# Patient Record
Sex: Female | Born: 1935 | Race: White | Hispanic: No | Smoking: Former smoker
Health system: Southern US, Community
[De-identification: ages and names within clinical notes are randomized; demographics above are authoritative.]

## PROBLEM LIST (undated history)

## (undated) DIAGNOSIS — I4891 Unspecified atrial fibrillation: Secondary | ICD-10-CM

## (undated) DIAGNOSIS — F0390 Unspecified dementia without behavioral disturbance: Secondary | ICD-10-CM

## (undated) DIAGNOSIS — F419 Anxiety disorder, unspecified: Secondary | ICD-10-CM

## (undated) DIAGNOSIS — M810 Age-related osteoporosis without current pathological fracture: Secondary | ICD-10-CM

## (undated) DIAGNOSIS — I219 Acute myocardial infarction, unspecified: Secondary | ICD-10-CM

## (undated) DIAGNOSIS — D649 Anemia, unspecified: Secondary | ICD-10-CM

## (undated) DIAGNOSIS — F32A Depression, unspecified: Secondary | ICD-10-CM

## (undated) DIAGNOSIS — I495 Sick sinus syndrome: Secondary | ICD-10-CM

## (undated) DIAGNOSIS — F329 Major depressive disorder, single episode, unspecified: Secondary | ICD-10-CM

## (undated) DIAGNOSIS — I10 Essential (primary) hypertension: Secondary | ICD-10-CM

## (undated) DIAGNOSIS — K219 Gastro-esophageal reflux disease without esophagitis: Secondary | ICD-10-CM

## (undated) HISTORY — PX: TONSILLECTOMY: SUR1361

## (undated) HISTORY — PX: PACEMAKER INSERTION: SHX728

## (undated) HISTORY — PX: BREAST CYST EXCISION: SHX579

## (undated) HISTORY — PX: RECTOCELE REPAIR: SHX761

## (undated) HISTORY — PX: APPENDECTOMY: SHX54

## (undated) HISTORY — PX: ABDOMINAL HYSTERECTOMY: SHX81

## (undated) HISTORY — PX: HERNIA REPAIR: SHX51

---

## 2013-06-25 ENCOUNTER — Encounter (HOSPITAL_COMMUNITY): Payer: Self-pay | Admitting: *Deleted

## 2013-06-25 ENCOUNTER — Inpatient Hospital Stay (HOSPITAL_COMMUNITY)
Admission: EM | Admit: 2013-06-25 | Discharge: 2013-06-27 | DRG: 281 | Disposition: A | Discharge status: Home Health | Payer: Medicare Other | Attending: Cardiovascular Disease | Admitting: Cardiovascular Disease

## 2013-06-25 ENCOUNTER — Emergency Department (HOSPITAL_COMMUNITY): Payer: Medicare Other

## 2013-06-25 DIAGNOSIS — I369 Nonrheumatic tricuspid valve disorder, unspecified: Secondary | ICD-10-CM

## 2013-06-25 DIAGNOSIS — I10 Essential (primary) hypertension: Secondary | ICD-10-CM | POA: Diagnosis present

## 2013-06-25 DIAGNOSIS — I495 Sick sinus syndrome: Secondary | ICD-10-CM | POA: Diagnosis present

## 2013-06-25 DIAGNOSIS — I959 Hypotension, unspecified: Secondary | ICD-10-CM | POA: Diagnosis present

## 2013-06-25 DIAGNOSIS — Z79899 Other long term (current) drug therapy: Secondary | ICD-10-CM

## 2013-06-25 DIAGNOSIS — Z8249 Family history of ischemic heart disease and other diseases of the circulatory system: Secondary | ICD-10-CM

## 2013-06-25 DIAGNOSIS — K219 Gastro-esophageal reflux disease without esophagitis: Secondary | ICD-10-CM | POA: Diagnosis present

## 2013-06-25 DIAGNOSIS — Z950 Presence of cardiac pacemaker: Secondary | ICD-10-CM

## 2013-06-25 DIAGNOSIS — I5181 Takotsubo syndrome: Secondary | ICD-10-CM | POA: Diagnosis present

## 2013-06-25 DIAGNOSIS — Z9089 Acquired absence of other organs: Secondary | ICD-10-CM

## 2013-06-25 DIAGNOSIS — D649 Anemia, unspecified: Secondary | ICD-10-CM

## 2013-06-25 DIAGNOSIS — Z7982 Long term (current) use of aspirin: Secondary | ICD-10-CM

## 2013-06-25 DIAGNOSIS — Z882 Allergy status to sulfonamides status: Secondary | ICD-10-CM

## 2013-06-25 DIAGNOSIS — E7800 Pure hypercholesterolemia, unspecified: Secondary | ICD-10-CM | POA: Diagnosis present

## 2013-06-25 DIAGNOSIS — R079 Chest pain, unspecified: Secondary | ICD-10-CM

## 2013-06-25 DIAGNOSIS — Z87891 Personal history of nicotine dependence: Secondary | ICD-10-CM

## 2013-06-25 DIAGNOSIS — I059 Rheumatic mitral valve disease, unspecified: Secondary | ICD-10-CM | POA: Diagnosis present

## 2013-06-25 DIAGNOSIS — I214 Non-ST elevation (NSTEMI) myocardial infarction: Principal | ICD-10-CM | POA: Diagnosis present

## 2013-06-25 DIAGNOSIS — I2510 Atherosclerotic heart disease of native coronary artery without angina pectoris: Secondary | ICD-10-CM | POA: Diagnosis present

## 2013-06-25 DIAGNOSIS — I4891 Unspecified atrial fibrillation: Secondary | ICD-10-CM | POA: Diagnosis present

## 2013-06-25 DIAGNOSIS — I079 Rheumatic tricuspid valve disease, unspecified: Secondary | ICD-10-CM | POA: Diagnosis present

## 2013-06-25 DIAGNOSIS — I422 Other hypertrophic cardiomyopathy: Secondary | ICD-10-CM | POA: Diagnosis not present

## 2013-06-25 HISTORY — DX: Unspecified atrial fibrillation: I48.91

## 2013-06-25 HISTORY — DX: Sick sinus syndrome: I49.5

## 2013-06-25 HISTORY — DX: Anemia, unspecified: D64.9

## 2013-06-25 HISTORY — DX: Gastro-esophageal reflux disease without esophagitis: K21.9

## 2013-06-25 HISTORY — DX: Essential (primary) hypertension: I10

## 2013-06-25 LAB — CBC WITH DIFFERENTIAL/PLATELET
Basophils Absolute: 0 10*3/uL (ref 0.0–0.1)
Basophils Relative: 0 % (ref 0–1)
Eosinophils Absolute: 0 10*3/uL (ref 0.0–0.7)
Hemoglobin: 12.1 g/dL (ref 12.0–15.0)
MCH: 33.8 pg (ref 26.0–34.0)
MCHC: 34.1 g/dL (ref 30.0–36.0)
Neutro Abs: 4.6 10*3/uL (ref 1.7–7.7)
Neutrophils Relative %: 73 % (ref 43–77)
Platelets: 198 10*3/uL (ref 150–400)

## 2013-06-25 LAB — BASIC METABOLIC PANEL
BUN: 14 mg/dL (ref 6–23)
Calcium: 9.7 mg/dL (ref 8.4–10.5)
GFR calc Af Amer: 74 mL/min — ABNORMAL LOW (ref >90)
GFR calc non Af Amer: 63 mL/min — ABNORMAL LOW (ref >90)
Glucose, Bld: 108 mg/dL — ABNORMAL HIGH (ref 70–99)
Potassium: 3.9 mEq/L (ref 3.5–5.1)
Sodium: 138 mEq/L (ref 135–145)

## 2013-06-25 LAB — HEPATIC FUNCTION PANEL
AST: 35 U/L (ref 0–37)
Bilirubin, Direct: <0.1 mg/dL (ref 0.0–0.3)
Total Protein: 7.4 g/dL (ref 6.0–8.3)

## 2013-06-25 LAB — MRSA PCR SCREENING: MRSA by PCR: NEGATIVE

## 2013-06-25 LAB — PROTIME-INR: INR: 1.26 (ref 0.00–1.49)

## 2013-06-25 LAB — TROPONIN I
Troponin I: 1.08 ng/mL (ref <0.30)
Troponin I: 0.8 ng/mL (ref <0.30)

## 2013-06-25 LAB — HEMOGLOBIN A1C: Mean Plasma Glucose: 103 mg/dL (ref <117)

## 2013-06-25 MED ORDER — ONDANSETRON HCL 4 MG/2ML IJ SOLN: 4.0000 mg | Freq: Four times a day (QID) | INTRAMUSCULAR | Status: DC | PRN

## 2013-06-25 MED ORDER — ASPIRIN EC 81 MG PO TBEC
81.0000 mg | DELAYED_RELEASE_TABLET | Freq: Every day | ORAL | Status: DC
  Administered 2013-06-26 – 2013-06-27 (×2): 81 mg via ORAL
  Filled 2013-06-25 (×2): qty 1

## 2013-06-25 MED ORDER — ROPINIROLE HCL 1 MG PO TABS
1.0000 mg | ORAL_TABLET | Freq: Every day | ORAL | Status: DC
  Administered 2013-06-25 – 2013-06-26 (×2): 1 mg via ORAL
  Filled 2013-06-25 (×4): qty 1

## 2013-06-25 MED ORDER — SODIUM CHLORIDE 0.9 % IJ SOLN
3.0000 mL | Freq: Two times a day (BID) | INTRAMUSCULAR | Status: DC
  Administered 2013-06-26: 3 mL via INTRAVENOUS

## 2013-06-25 MED ORDER — ASPIRIN 81 MG PO CHEW
324.0000 mg | CHEWABLE_TABLET | ORAL | Status: AC
  Administered 2013-06-25: 324 mg via ORAL
  Filled 2013-06-25: qty 4

## 2013-06-25 MED ORDER — OMEGA-3 FATTY ACIDS 1000 MG PO CAPS: 1.0000 g | ORAL_CAPSULE | Freq: Every day | ORAL | Status: DC

## 2013-06-25 MED ORDER — ACETAMINOPHEN 325 MG PO TABS
650.0000 mg | ORAL_TABLET | ORAL | Status: DC | PRN
  Administered 2013-06-25: 650 mg via ORAL
  Filled 2013-06-25: qty 2

## 2013-06-25 MED ORDER — METOPROLOL SUCCINATE ER 25 MG PO TB24
25.0000 mg | ORAL_TABLET | Freq: Every day | ORAL | Status: DC
  Administered 2013-06-25: 25 mg via ORAL
  Filled 2013-06-25 (×3): qty 1

## 2013-06-25 MED ORDER — CALCIUM CARBONATE-VITAMIN D 500-200 MG-UNIT PO TABS
1.0000 | ORAL_TABLET | Freq: Two times a day (BID) | ORAL | Status: DC
  Administered 2013-06-25 – 2013-06-27 (×4): 1 via ORAL
  Filled 2013-06-25 (×6): qty 1

## 2013-06-25 MED ORDER — LISINOPRIL 5 MG PO TABS
5.0000 mg | ORAL_TABLET | Freq: Every day | ORAL | Status: DC
  Administered 2013-06-25 – 2013-06-27 (×3): 5 mg via ORAL
  Filled 2013-06-25 (×3): qty 1

## 2013-06-25 MED ORDER — CALCIUM CARBONATE-VITAMIN D 600-400 MG-UNIT PO TABS: 1.0000 | ORAL_TABLET | Freq: Two times a day (BID) | ORAL | Status: DC

## 2013-06-25 MED ORDER — HEPARIN BOLUS VIA INFUSION
3000.0000 [IU] | Freq: Once | INTRAVENOUS | Status: AC
  Administered 2013-06-25: 3000 [IU] via INTRAVENOUS
  Filled 2013-06-25: qty 3000

## 2013-06-25 MED ORDER — MORPHINE SULFATE 2 MG/ML IJ SOLN
2.0000 mg | Freq: Once | INTRAMUSCULAR | Status: AC
  Administered 2013-06-25: 2 mg via INTRAVENOUS
  Filled 2013-06-25: qty 1

## 2013-06-25 MED ORDER — NITROGLYCERIN 0.4 MG SL SUBL: 0.4000 mg | SUBLINGUAL_TABLET | SUBLINGUAL | Status: DC | PRN

## 2013-06-25 MED ORDER — ALPRAZOLAM 0.25 MG PO TABS
0.2500 mg | ORAL_TABLET | Freq: Two times a day (BID) | ORAL | Status: DC | PRN
  Administered 2013-06-25 – 2013-06-26 (×2): 0.25 mg via ORAL
  Filled 2013-06-25 (×2): qty 1

## 2013-06-25 MED ORDER — SODIUM CHLORIDE 0.9 % IJ SOLN: 3.0000 mL | INTRAMUSCULAR | Status: DC | PRN

## 2013-06-25 MED ORDER — HEPARIN (PORCINE) IN NACL 100-0.45 UNIT/ML-% IJ SOLN
750.0000 [IU]/h | INTRAMUSCULAR | Status: DC
  Administered 2013-06-25: 750 [IU]/h via INTRAVENOUS
  Filled 2013-06-25 (×2): qty 250

## 2013-06-25 MED ORDER — ADULT MULTIVITAMIN W/MINERALS CH
1.0000 | ORAL_TABLET | Freq: Every day | ORAL | Status: DC
  Administered 2013-06-25 – 2013-06-27 (×3): 1 via ORAL
  Filled 2013-06-25 (×3): qty 1

## 2013-06-25 MED ORDER — OMEGA-3-ACID ETHYL ESTERS 1 G PO CAPS
1.0000 g | ORAL_CAPSULE | Freq: Every day | ORAL | Status: DC
  Administered 2013-06-25 – 2013-06-27 (×3): 1 g via ORAL
  Filled 2013-06-25 (×3): qty 1

## 2013-06-25 MED ORDER — PANTOPRAZOLE SODIUM 40 MG PO TBEC
40.0000 mg | DELAYED_RELEASE_TABLET | Freq: Every day | ORAL | Status: DC
  Administered 2013-06-25 – 2013-06-27 (×3): 40 mg via ORAL
  Filled 2013-06-25 (×3): qty 1

## 2013-06-25 MED ORDER — GABAPENTIN 300 MG PO CAPS
300.0000 mg | ORAL_CAPSULE | Freq: Two times a day (BID) | ORAL | Status: DC
  Administered 2013-06-25 – 2013-06-27 (×4): 300 mg via ORAL
  Filled 2013-06-25 (×6): qty 1

## 2013-06-25 MED ORDER — ATORVASTATIN CALCIUM 40 MG PO TABS
40.0000 mg | ORAL_TABLET | Freq: Every day | ORAL | Status: DC
  Administered 2013-06-25: 40 mg via ORAL
  Filled 2013-06-25 (×3): qty 1

## 2013-06-25 MED ORDER — TRAMADOL HCL 50 MG PO TABS
50.0000 mg | ORAL_TABLET | Freq: Two times a day (BID) | ORAL | Status: DC | PRN
  Administered 2013-06-25: 50 mg via ORAL
  Filled 2013-06-25: qty 1

## 2013-06-25 MED ORDER — KETOROLAC TROMETHAMINE 30 MG/ML IJ SOLN: 30.0000 mg | Freq: Once | INTRAMUSCULAR | Status: DC

## 2013-06-25 MED ORDER — SODIUM CHLORIDE 0.9 % IV SOLN
250.0000 mL | INTRAVENOUS | Status: DC | PRN
  Administered 2013-06-25: 250 mL via INTRAVENOUS

## 2013-06-25 NOTE — ED Notes (Signed)
ZOX:[id number]<AV> Expected date:<BR> Expected time:<BR> Means of arrival:<BR> Comments:<BR> Pt in room

## 2013-06-25 NOTE — ED Notes (Signed)
Pt reports left sided chest pain that started last night, increased throughout the night. Pain 8/10. Pain increases upon inspiration. Hx of pacemaker, placed in 2012. Reports she wears oxygen at night. Reports increased SOB. Pt recently moved this week from [place] to [place]. Pt reports she has been under a lot of stress lately.

## 2013-06-25 NOTE — ED Provider Notes (Signed)
History    CSN: [id number] Arrival date & time 06/25/13  0901  First MD Initiated Contact with Patient 06/25/13 [phone]     Chief Complaint  Patient presents with  . Chest Pain   (Consider location/radiation/quality/duration/timing/severity/associated sxs/prior Treatment) HPI.... left-sided chest pain since last evening with no radiation, worse with deep breaths, described as a stabbing pain, with associated dyspnea.   No nausea or diaphoresis.  Status post pacemaker placement 2 years ago in [place]. Additional cardiac risk factors include hypertension and hypercholesterolemia. Son had a fatal MI at age 1 and mother at 73.   Past Medical History  Diagnosis Date  . Hypertension   . Heart defect     pt unsure why, but pacemaker place 2012   Past Surgical History  Procedure Laterality Date  . Pacemaker insertion      2012  . Appendectomy    . Hernia repair    . Abdominal hysterectomy      partial  . Tonsillectomy    . Breast cyst excision     History reviewed. No pertinent family history. History  Substance Use Topics  . Smoking status: Former Smoker -- 1.00 packs/day for 25 years  . Smokeless tobacco: Not on file  . Alcohol Use: No   OB History   Grav Para Term Preterm Abortions TAB SAB Ect Mult Living                 Review of Systems  All other systems reviewed and are negative.    Allergies  Sulfa antibiotics  Home Medications   Current Outpatient Rx  Name  Route  Sig  Dispense  Refill  . atorvastatin (LIPITOR) 10 MG tablet   Oral   Take 10 mg by mouth daily.         . Biotin 3 MG TABS   Oral   Take 6 mg by mouth daily.         . Calcium Carbonate-Vitamin D (CALCIUM 600 + D PO)   Oral   Take 600 mg by mouth 2 (two) times daily.         . dabigatran (PRADAXA) 150 MG CAPS   Oral   Take 150 mg by mouth every 12 (twelve) hours.         . fish oil-omega-3 fatty acids 1000 MG capsule   Oral   Take 1 g by mouth daily.         Marland Kitchen  lisinopril (PRINIVIL,ZESTRIL) 5 MG tablet   Oral   Take 5 mg by mouth daily.         . memantine (NAMENDA) 10 MG tablet   Oral   Take 10 mg by mouth 2 (two) times daily.         . metoprolol succinate (TOPROL-XL) 25 MG 24 hr tablet   Oral   Take 25 mg by mouth daily.         . Multiple Vitamins-Minerals (MULTIVITAMIN WITH MINERALS) tablet   Oral   Take 1 tablet by mouth daily.         Marland Kitchen omeprazole (PRILOSEC) 20 MG capsule   Oral   Take 20 mg by mouth daily.         . simvastatin (ZOCOR) 40 MG tablet   Oral   Take 40 mg by mouth every evening.         . vitamin E 1000 UNIT capsule   Oral   Take 1,000 Units by mouth 2 (two) times  daily.          BP 118/60  Pulse 63  Temp(Src) 98 F (36.7 C)  Resp 20  SpO2 98% Physical Exam  Nursing note and vitals reviewed. Constitutional: She is oriented to person, place, and time.  Frail but pleasant;  mental status normal  HENT:  Head: Normocephalic and atraumatic.  Eyes: Conjunctivae and EOM are normal. Pupils are equal, round, and reactive to light.  Neck: Normal range of motion. Neck supple.  Cardiovascular: Normal rate, regular rhythm and normal heart sounds.   Pulmonary/Chest: Effort normal and breath sounds normal.  Abdominal: Soft. Bowel sounds are normal.  Musculoskeletal: Normal range of motion.  Neurological: She is alert and oriented to person, place, and time.  Skin: Skin is warm and dry.  Psychiatric: She has a normal mood and affect.    ED Course  Procedures (including critical care time) Labs Reviewed  BASIC METABOLIC PANEL - Abnormal; Notable for the following:    Glucose, Bld 108 (*)    GFR calc non Af Amer 63 (*)    GFR calc Af Amer 74 (*)    All other components within normal limits  CBC WITH DIFFERENTIAL - Abnormal; Notable for the following:    RBC 3.58 (*)    HCT 35.5 (*)    All other components within normal limits  TROPONIN I - Abnormal; Notable for the following:    Troponin I  1.08 (*)    All other components within normal limits   Dg Chest Portable 1 View  06/25/2013   *RADIOLOGY REPORT*  Clinical Data: Chest pain  PORTABLE CHEST - 1 VIEW  Comparison: None  Findings: There is a left chest wall pacer device with lead in the right ventricle.  There is mild cardiac enlargement.  Pulmonary vascular congestion is noted bilaterally.  No airspace consolidation.  IMPRESSION:  1.  Mild cardiac enlargement and pulmonary vascular congestion.   Original Report Authenticated By: Signa Kell, M.D.     Date: 06/25/2013  Rate: 65  Rhythm: paced rhythm  QRS Axis: normal  Intervals: normal  ST/T Wave abnormalities: normal  Conduction Disutrbances:none  Narrative Interpretation:   Old EKG Reviewed: none available  No diagnosis found.  MDM  Chest pain with multiple risk factors. Initial troponin positive at 1.08  Consult cardiology.   Patient is on Pradaxa.  Hold aspirin    Donnetta Hutching, MD 06/25/13 1051

## 2013-06-25 NOTE — ED Notes (Signed)
MD at bedside. 

## 2013-06-25 NOTE — H&P (Signed)
History and Physical  Patient ID: Wave Calzada MRN: [id number], DOB: [birth year] Date of Encounter: 06/25/2013, 12:05 PM Primary Physician: No primary provider on file. Primary Cardiologist: Dr. Daryel November in [place], pt just moved to [facility]  Chief Complaint: chest pain Reason for Admission: NSTEMI, first troponin 1.08  HPI: Ms. Franta is an 79 y/o F with no history of CAD but a history of HTN, afib on Pradaxa with tachybrady syndrome s/p pacemaker, GERD, and significant family history of CAD who presented to [facility] today with chest pain and SOB. (The patient was unaware of why she had a pacemaker and was on Pradaxa, but I spoke with Dr. Rondel Baton nurse for confirmation of history.) She and her husband moved to [place] just a few days ago to live in [place] after they found it difficult to keep up their longtime home in [place]. She has found this process extremely stressful and has had trouble sleeping. Last night around 7pm while at rest, she developed significant SOB and substernal sharp, severe chest pain that was worse with breathing. She denies orthopnea, LEE, palpitations or syncope. She did not try anything for the discomfort and finally called a family member to take her to the ER this morning. The chest pain subsided before coming to the ER. She was given morphine on arrival which she was hoping would help her sleep but she hasn't felt much different. She still has mild SOB but it is much improved from last night. Initial troponin is 1.08, EKG v-paced, CXR with mild cardiac enlargement and pulm vascular congestion. She thinks she took her Pradaxa this morning but is not entirely sure. VSS.  Past Medical History  Diagnosis Date  . Hypertension   . Sick sinus syndrome     Pacemaker ([employer]tr[employer]) - VVIR placed 2012 in [place], [place] & was put on Pradaxa  . GERD (gastroesophageal reflux disease)   . A-fib     Confirmed with pt's cardiology office -  Holter with tachybrady -> went on to have pacemaker     Most Recent Cardiac Studies: None available. Dr. Rondel Baton office said she had a nuclear in 2010 that looked OK. No recent echo in their system.   Surgical History:  Past Surgical History  Procedure Laterality Date  . Pacemaker insertion      2012  . Appendectomy    . Hernia repair    . Abdominal hysterectomy      partial  . Tonsillectomy    . Breast cyst excision    . Rectocele repair      In 2000 - had complications from surgery - abdominal organs couldn't function, in critical condition & in hospital for 2 weeks     Home Meds: Prior to Admission medications   Medication Sig Start Date End Date Taking? Authorizing Provider  Biotin 3 MG TABS Take 6 mg by mouth daily.   Yes Historical Provider, MD  Calcium Carbonate-Vitamin D (CALCIUM 600 + D PO) Take 600 mg by mouth 2 (two) times daily.   Yes Historical Provider, MD  dabigatran (PRADAXA) 150 MG CAPS Take 150 mg by mouth every 12 (twelve) hours.   Yes Historical Provider, MD  fish oil-omega-3 fatty acids 1000 MG capsule Take 1 g by mouth daily.   Yes Historical Provider, MD  lisinopril (PRINIVIL,ZESTRIL) 5 MG tablet Take 5 mg by mouth daily.   Yes Historical Provider, MD  metoprolol succinate (TOPROL-XL) 25 MG 24 hr tablet Take 25 mg by mouth at  bedtime.    Yes Historical Provider, MD  Multiple Vitamins-Minerals (MULTIVITAMIN WITH MINERALS) tablet Take 1 tablet by mouth daily.   Yes Historical Provider, MD  omeprazole (PRILOSEC) 20 MG capsule Take 20 mg by mouth daily.   Yes Historical Provider, MD  vitamin E 1000 UNIT capsule Take 1,000 Units by mouth 2 (two) times daily.   Yes Historical Provider, MD    Allergies:  Allergies  Allergen Reactions  . Sulfa Antibiotics Rash    History   Social History  . Marital Status: Married    Spouse Name: N/A    Number of Children: N/A  . Years of Education: N/A   Occupational History  . Not on file.   Social History Main  Topics  . Smoking status: Former Smoker -- 1.00 packs/day for 25 years  . Smokeless tobacco: Not on file  . Alcohol Use: No  . Drug Use: No  . Sexually Active: Not on file   Other Topics Concern  . Not on file   Social History Narrative  . No narrative on file     Family History  Problem Relation Age of Onset  . Heart attack Son     Died at 65 of MI  . Heart attack Mother     Died at 54 of MI    Review of Systems: General: negative for chills, fever.  Cardiovascular: see above Dermatological: negative for rash Respiratory: negative for cough Urologic: negative for hematuria Abdominal: negative for nausea, vomiting, bleeding Neurologic: negative for visual changes, syncope, or dizziness All other systems reviewed and are otherwise negative except as noted above.  Labs:   Lab Results  Component Value Date   WBC 6.3 06/25/2013   HGB 12.1 06/25/2013   HCT 35.5* 06/25/2013   MCV 99.2 06/25/2013   PLT 198 06/25/2013     Recent Labs Lab 06/25/13 0912  NA 138  K 3.9  CL 100  CO2 27  BUN 14  CREATININE 0.82  CALCIUM 9.7  GLUCOSE 108*    Recent Labs  06/25/13 0912  TROPONINI 1.08*    Radiology/Studies:  Dg Chest Portable 1 View 06/25/2013   *RADIOLOGY REPORT*  Clinical Data: Chest pain  PORTABLE CHEST - 1 VIEW  Comparison: None  Findings: There is a left chest wall pacer device with lead in the right ventricle.  There is mild cardiac enlargement.  Pulmonary vascular congestion is noted bilaterally.  No airspace consolidation.  IMPRESSION:  1.  Mild cardiac enlargement and pulmonary vascular congestion.   Original Report Authenticated By: Signa Kell, M.D.     EKG: vpaced 65bpm ?underlying rhythm afib, deep TWI V4-V6, TWI to a lesser degree avL,no prior to compare to  Physical Exam: Blood pressure 114/54, pulse 60, temperature 98 F (36.7 C), resp. rate 23, SpO2 96.00%. General: Well developed thin elderly WF in no acute distress. Head: Normocephalic,  atraumatic, sclera non-icteric, no xanthomas, nares are without discharge.  Neck: Negative for carotid bruits. JVD not elevated. Lungs: Clear bilaterally to auscultation without wheezes, rales, or rhonchi. Breathing is unlabored. Heart: RRR with S1 S2. No murmurs, rubs, or gallops appreciated. Abdomen: Soft, non-tender, non-distended with normoactive bowel sounds. No hepatomegaly. No rebound/guarding. No obvious abdominal masses. Msk:  Strength and tone appear normal for age. Extremities: No clubbing or cyanosis. No edema.  Distal pedal pulses are 2+ and equal bilaterally. Neuro: Alert and oriented X 3. No focal deficit. No facial asymmetry. Moves all extremities spontaneously. Psych:  Responds to questions appropriately with  a normal affect.    ASSESSMENT AND PLAN:  1. NSTEMI - will admit, cycle cardiac enzymes, start heparin per pharmacy tonight (pt thinks last dose of Pradaxa was this morning). She is not convinced she wants to proceed with heart catheterization yet so will tentatively keep NPO after midnight and reassess in AM. Per discussion with Dr. Eden Emms, will keep at [facility] but will need transfer to [facility] if she elects cath. Check echocardiogram (called them to do today).  Add ASA for now. PE less likely given consistent anticoagulation with Pradaxa, but there is a mild pleuritic component. Check lipids. Add statin. 2. Atrial fibrillation with tachybrady syndrome s/p pacemaker 2012 - will ask [employer] to interrogate pacemaker. Hold Pradaxa as above. 3. HTN, controlled - continue home regimen. 4. Reported history of anemia - H/H ok. No reported bleeding. Continue PPI.  Signed, Ronie Spies PA-C 06/25/2013, 12:05 PM  Patient examined chart reviewed. SEMI in elderly female with no history of CAD.  ECG unreveiling due to pacing. Currently pain free. Will have to see how troponins trend and EF by echo but suspect cath in am will be best.  Pradaxa held so should be ok for PM case tomorrow.  If echo  normal and troponin goes no higher may consider myvue as patient would like to avoid cath if possible  Charlton Haws

## 2013-06-25 NOTE — Progress Notes (Signed)
  Echocardiogram 2D Echocardiogram has been performed.  Cathie Beams 06/25/2013, 2:33 PM

## 2013-06-25 NOTE — Progress Notes (Signed)
ANTICOAGULATION CONSULT NOTE - Initial Consult  Pharmacy Consult for Heparin  Indication: NSTEMI  Allergies  Allergen Reactions  . Sulfa Antibiotics Rash    Patient Measurements: Height: 5\' 1"  (154.9 cm) Weight: 145 lb (65.772 kg) IBW/kg (Calculated) : 47.8 Heparin Dosing Weight:  Vital Signs: Temp: 98.2 F (36.8 C) (07/17 1247) Temp src: Oral (07/17 1247) BP: 106/52 mmHg (07/17 1522) Pulse Rate: 72 (07/17 1522)  Labs:  Recent Labs  06/25/13 0912 06/25/13 1318  HGB 12.1  --   HCT 35.5*  --   PLT 198  --   CREATININE 0.82  --   TROPONINI 1.08* 0.80*    Estimated Creatinine Clearance: 43.6 ml/min (by C-G formula based on Cr of 0.82).   Medical History: Past Medical History  Diagnosis Date  . Hypertension   . Sick sinus syndrome     Pacemaker (Medtronic) - VVIR placed 2012 in [place], [place] & was put on Pradaxa  . GERD (gastroesophageal reflux disease)   . A-fib     Confirmed with pt's cardiology office - Holter with tachybrady -> went on to have pacemaker  . Anemia     Prior h/o such, per [facility] cardiologist's office    Medications:  Prescriptions prior to admission  Medication Sig Dispense Refill  . Biotin 3 MG TABS Take 6 mg by mouth daily.      . Calcium Carbonate-Vitamin D (CALCIUM 600 + D PO) Take 600 mg by mouth 2 (two) times daily.      . dabigatran (PRADAXA) 150 MG CAPS Take 150 mg by mouth every 12 (twelve) hours.      . fish oil-omega-3 fatty acids 1000 MG capsule Take 1 g by mouth daily.      Marland Kitchen gabapentin (NEURONTIN) 300 MG capsule Take 300 mg by mouth 2 (two) times daily.      Marland Kitchen lisinopril (PRINIVIL,ZESTRIL) 5 MG tablet Take 5 mg by mouth daily.      . metoprolol succinate (TOPROL-XL) 25 MG 24 hr tablet Take 25 mg by mouth at bedtime.       . Multiple Vitamins-Minerals (MULTIVITAMIN WITH MINERALS) tablet Take 1 tablet by mouth daily.      Marland Kitchen omeprazole (PRILOSEC) 20 MG capsule Take 20 mg by mouth daily.      Marland Kitchen rOPINIRole (REQUIP) 1 MG tablet  Take 1 mg by mouth at bedtime.      . vitamin E 1000 UNIT capsule Take 1,000 Units by mouth 2 (two) times daily.       Scheduled:  . [START ON 06/26/2013] aspirin EC  81 mg Oral Daily  . atorvastatin  40 mg Oral q1800  . calcium-vitamin D  1 tablet Oral BID  . gabapentin  300 mg Oral BID  . lisinopril  5 mg Oral Daily  . metoprolol succinate  25 mg Oral QHS  . multivitamin with minerals  1 tablet Oral Daily  . omega-3 acid ethyl esters  1 g Oral Daily  . pantoprazole  40 mg Oral Daily  . rOPINIRole  1 mg Oral QHS  . sodium chloride  3 mL Intravenous Q12H   Infusions:   PRN: sodium chloride, acetaminophen, nitroGLYCERIN, ondansetron (ZOFRAN) IV, sodium chloride Anti-infectives   None      Assessment:  79 yo F admitted with NSTEMI starting heparin per pharmacy.  Patient is on Pradaxa at home, Last dose was this morning at 7am.  Per protocol wait 12 hours after last Pradaxa dose before initating IV anticoagulation.  Patients Scr  is WNL (0.82) with estimated CrCl 43 ml/min.   Baseline CBC is WNL, will order baseline INR  Goal of Therapy:  Heparin level 0.3-0.7 units/ml Monitor platelets by anticoagulation protocol: Yes   Plan:  1.) Wait 12 hours after last pradaxa dose to start IV heparin, Will start at 7pm 2.) Given age, will give partial bolus of 3000 units x 1, then start 750 units/hr 3.) Daily heparin level and CBC, PT/INR now 4.) Heparin level 8 hours after gtt starts given age > 32 yo 5.) Hold Pradaxa  Latesa Fratto, Loma Messing PharmD Pager #: [phone] 4:10 PM 06/25/2013

## 2013-06-26 ENCOUNTER — Encounter (HOSPITAL_COMMUNITY)
Admission: EM | Disposition: A | Discharge status: Home Health | Payer: Self-pay | Source: Home / Self Care | Attending: Cardiovascular Disease

## 2013-06-26 DIAGNOSIS — I214 Non-ST elevation (NSTEMI) myocardial infarction: Secondary | ICD-10-CM

## 2013-06-26 HISTORY — PX: LEFT HEART CATHETERIZATION WITH CORONARY ANGIOGRAM: SHX5451

## 2013-06-26 LAB — LIPID PANEL
LDL Cholesterol: 67 mg/dL (ref 0–99)
VLDL: 16 mg/dL (ref 0–40)

## 2013-06-26 LAB — CBC
HCT: 32.3 % — ABNORMAL LOW (ref 36.0–46.0)
Hemoglobin: 10.8 g/dL — ABNORMAL LOW (ref 12.0–15.0)
MCH: 33.8 pg (ref 26.0–34.0)
MCV: 100.9 fL — ABNORMAL HIGH (ref 78.0–100.0)
MCV: 98.9 fL (ref 78.0–100.0)
Platelets: 162 10*3/uL (ref 150–400)
RBC: 3.2 MIL/uL — ABNORMAL LOW (ref 3.87–5.11)
RBC: 3.64 MIL/uL — ABNORMAL LOW (ref 3.87–5.11)
RDW: 12.7 % (ref 11.5–15.5)
WBC: 5.3 10*3/uL (ref 4.0–10.5)

## 2013-06-26 LAB — TROPONIN I: Troponin I: <0.3 ng/mL (ref <0.30)

## 2013-06-26 LAB — BASIC METABOLIC PANEL
CO2: 28 mEq/L (ref 19–32)
Calcium: 9.5 mg/dL (ref 8.4–10.5)
Creatinine, Ser: 0.85 mg/dL (ref 0.50–1.10)
Glucose, Bld: 107 mg/dL — ABNORMAL HIGH (ref 70–99)

## 2013-06-26 LAB — CREATININE, SERUM
Creatinine, Ser: 0.76 mg/dL (ref 0.50–1.10)
GFR calc Af Amer: 87 mL/min — ABNORMAL LOW (ref >90)
GFR calc non Af Amer: 75 mL/min — ABNORMAL LOW (ref >90)

## 2013-06-26 SURGERY — LEFT HEART CATHETERIZATION WITH CORONARY ANGIOGRAM
Anesthesia: LOCAL

## 2013-06-26 MED ORDER — FENTANYL CITRATE 0.05 MG/ML IJ SOLN
INTRAMUSCULAR | Status: AC
  Filled 2013-06-26: qty 2

## 2013-06-26 MED ORDER — MIDAZOLAM HCL 2 MG/2ML IJ SOLN
INTRAMUSCULAR | Status: AC
  Filled 2013-06-26: qty 2

## 2013-06-26 MED ORDER — SODIUM CHLORIDE 0.9 % IJ SOLN: 3.0000 mL | Freq: Two times a day (BID) | INTRAMUSCULAR | Status: DC

## 2013-06-26 MED ORDER — SODIUM CHLORIDE 0.9 % IJ SOLN: 3.0000 mL | INTRAMUSCULAR | Status: DC | PRN

## 2013-06-26 MED ORDER — ACETAMINOPHEN 325 MG PO TABS
650.0000 mg | ORAL_TABLET | ORAL | Status: DC | PRN
  Administered 2013-06-27: 650 mg via ORAL
  Filled 2013-06-26: qty 2

## 2013-06-26 MED ORDER — SODIUM CHLORIDE 0.9 % IV SOLN: 250.0000 mL | INTRAVENOUS | Status: DC | PRN

## 2013-06-26 MED ORDER — SODIUM CHLORIDE 0.9 % IV SOLN: INTRAVENOUS | Status: AC

## 2013-06-26 MED ORDER — NITROGLYCERIN 0.2 MG/ML ON CALL CATH LAB
INTRAVENOUS | Status: AC
  Filled 2013-06-26: qty 1

## 2013-06-26 MED ORDER — ONDANSETRON HCL 4 MG/2ML IJ SOLN: 4.0000 mg | Freq: Four times a day (QID) | INTRAMUSCULAR | Status: DC | PRN

## 2013-06-26 MED ORDER — HEPARIN (PORCINE) IN NACL 2-0.9 UNIT/ML-% IJ SOLN
INTRAMUSCULAR | Status: AC
  Filled 2013-06-26: qty 1000

## 2013-06-26 MED ORDER — HEPARIN SODIUM (PORCINE) 5000 UNIT/ML IJ SOLN
5000.0000 [IU] | Freq: Three times a day (TID) | INTRAMUSCULAR | Status: DC
  Administered 2013-06-26 – 2013-06-27 (×2): 5000 [IU] via SUBCUTANEOUS
  Filled 2013-06-26 (×5): qty 1

## 2013-06-26 MED ORDER — LIDOCAINE HCL (PF) 1 % IJ SOLN
INTRAMUSCULAR | Status: AC
  Filled 2013-06-26: qty 30

## 2013-06-26 NOTE — Progress Notes (Signed)
ANTICOAGULATION CONSULT NOTE - Follow Up Consult  Pharmacy Consult for Heparin Indication: NSTEMI  Allergies  Allergen Reactions  . Sulfa Antibiotics Rash    Patient Measurements: Height: 5\' 1"  (154.9 cm) Weight: 145 lb (65.772 kg) IBW/kg (Calculated) : 47.8 Heparin Dosing Weight:   Vital Signs: Temp: 97.8 F (36.6 C) (07/18 0400) Temp src: Axillary (07/18 0400) BP: 115/94 mmHg (07/18 0420) Pulse Rate: 59 (07/18 0400)  Labs:  Recent Labs  06/25/13 0912 06/25/13 1318 06/25/13 1516 06/25/13 1935 06/26/13 0055 06/26/13 0525  HGB 12.1  --   --   --   --  10.8*  HCT 35.5*  --   --   --   --  32.3*  PLT 198  --   --   --   --  169  LABPROT  --   --  15.5*  --   --   --   INR  --   --  1.26  --   --   --   HEPARINUNFRC  --   --   --   --   --  0.34  CREATININE 0.82  --   --   --   --  0.85  TROPONINI 1.08* 0.80*  --  0.42* <0.30  --     Estimated Creatinine Clearance: 42 ml/min (by C-G formula based on Cr of 0.85).   Medications:  Infusions:  . heparin 750 Units/hr (06/26/13 0600)    Assessment: Patient with heparin level at goal.  No issues per RN.  Goal of Therapy:  Heparin level 0.3-0.7 units/ml Monitor platelets by anticoagulation protocol: Yes   Plan:  Continue heparin at current rate. Recheck level at 1400.  Darlina Guys, Jacquenette Shone Crowford 06/26/2013,6:23 AM

## 2013-06-26 NOTE — Progress Notes (Signed)
 SUBJECTIVE:  No further chest pain.  Anxious   PHYSICAL EXAM Filed Vitals:   06/26/13 0000 06/26/13 0032 06/26/13 0400 06/26/13 0420  BP:  110/40  115/94  Pulse: 59  59   Temp: 97.2 F (36.2 C)  97.8 F (36.6 C)   TempSrc: Oral  Axillary   Resp: 17  18   Height:      Weight:      SpO2: 95%  98%    General:  No distress Lungs:  Few basilar crackles Heart:  RRR Abdomen:  Positive bowel sounds, no rebound no guarding Extremities:  No edema Neuro:  Nofocal   LABS: Lab Results  Component Value Date   TROPONINI <0.30 06/26/2013   Results for orders placed during the hospital encounter of 06/25/13 (from the past 24 hour(s))  BASIC METABOLIC PANEL     Status: Abnormal   Collection Time    06/25/13  9:12 AM      Result Value Range   Sodium 138  135 - 145 mEq/L   Potassium 3.9  3.5 - 5.1 mEq/L   Chloride 100  96 - 112 mEq/L   CO2 27  19 - 32 mEq/L   Glucose, Bld 108 (*) 70 - 99 mg/dL   BUN 14  6 - 23 mg/dL   Creatinine, Ser 0.82  0.50 - 1.10 mg/dL   Calcium 9.7  8.4 - 10.5 mg/dL   GFR calc non Af Amer 63 (*) >90 mL/min   GFR calc Af Amer 74 (*) >90 mL/min  CBC WITH DIFFERENTIAL     Status: Abnormal   Collection Time    06/25/13  9:12 AM      Result Value Range   WBC 6.3  4.0 - 10.5 K/uL   RBC 3.58 (*) 3.87 - 5.11 MIL/uL   Hemoglobin 12.1  12.0 - 15.0 g/dL   HCT 35.5 (*) 36.0 - 46.0 %   MCV 99.2  78.0 - 100.0 fL   MCH 33.8  26.0 - 34.0 pg   MCHC 34.1  30.0 - 36.0 g/dL   RDW 12.5  11.5 - 15.5 %   Platelets 198  150 - 400 K/uL   Neutrophils Relative % 73  43 - 77 %   Neutro Abs 4.6  1.7 - 7.7 K/uL   Lymphocytes Relative 20  12 - 46 %   Lymphs Abs 1.3  0.7 - 4.0 K/uL   Monocytes Relative 6  3 - 12 %   Monocytes Absolute 0.4  0.1 - 1.0 K/uL   Eosinophils Relative 0  0 - 5 %   Eosinophils Absolute 0.0  0.0 - 0.7 K/uL   Basophils Relative 0  0 - 1 %   Basophils Absolute 0.0  0.0 - 0.1 K/uL  TROPONIN I     Status: Abnormal   Collection Time    06/25/13  9:12 AM        Result Value Range   Troponin I 1.08 (*) <0.30 ng/mL  TROPONIN I     Status: Abnormal   Collection Time    06/25/13  1:18 PM      Result Value Range   Troponin I 0.80 (*) <0.30 ng/mL  TSH     Status: None   Collection Time    06/25/13  1:18 PM      Result Value Range   TSH 4.284  0.350 - 4.500 uIU/mL  HEMOGLOBIN A1C     Status: None   Collection Time      06/25/13  1:18 PM      Result Value Range   Hemoglobin A1C 5.2  <5.7 %   Mean Plasma Glucose 103  <117 mg/dL  HEPATIC FUNCTION PANEL     Status: None   Collection Time    06/25/13  3:16 PM      Result Value Range   Total Protein 7.4  6.0 - 8.3 g/dL   Albumin 3.7  3.5 - 5.2 g/dL   AST 35  0 - 37 U/L   ALT 16  0 - 35 U/L   Alkaline Phosphatase 54  39 - 117 U/L   Total Bilirubin 0.5  0.3 - 1.2 mg/dL   Bilirubin, Direct <0.1  0.0 - 0.3 mg/dL   Indirect Bilirubin NOT CALCULATED  0.3 - 0.9 mg/dL  PROTIME-INR     Status: Abnormal   Collection Time    06/25/13  3:16 PM      Result Value Range   Prothrombin Time 15.5 (*) 11.6 - 15.2 seconds   INR 1.26  0.00 - 1.49  MRSA PCR SCREENING     Status: None   Collection Time    06/25/13  3:46 PM      Result Value Range   MRSA by PCR NEGATIVE  NEGATIVE  TROPONIN I     Status: Abnormal   Collection Time    06/25/13  7:35 PM      Result Value Range   Troponin I 0.42 (*) <0.30 ng/mL  TROPONIN I     Status: None   Collection Time    06/26/13 12:55 AM      Result Value Range   Troponin I <0.30  <0.30 ng/mL  CBC     Status: Abnormal   Collection Time    06/26/13  5:25 AM      Result Value Range   WBC 5.9  4.0 - 10.5 K/uL   RBC 3.20 (*) 3.87 - 5.11 MIL/uL   Hemoglobin 10.8 (*) 12.0 - 15.0 g/dL   HCT 32.3 (*) 36.0 - 46.0 %   MCV 100.9 (*) 78.0 - 100.0 fL   MCH 33.8  26.0 - 34.0 pg   MCHC 33.4  30.0 - 36.0 g/dL   RDW 12.7  11.5 - 15.5 %   Platelets 169  150 - 400 K/uL  BASIC METABOLIC PANEL     Status: Abnormal   Collection Time    06/26/13  5:25 AM      Result Value  Range   Sodium 133 (*) 135 - 145 mEq/L   Potassium 4.1  3.5 - 5.1 mEq/L   Chloride 99  96 - 112 mEq/L   CO2 28  19 - 32 mEq/L   Glucose, Bld 107 (*) 70 - 99 mg/dL   BUN 18  6 - 23 mg/dL   Creatinine, Ser 0.85  0.50 - 1.10 mg/dL   Calcium 9.5  8.4 - 10.5 mg/dL   GFR calc non Af Amer 61 (*) >90 mL/min   GFR calc Af Amer 70 (*) >90 mL/min  LIPID PANEL     Status: None   Collection Time    06/26/13  5:25 AM      Result Value Range   Cholesterol 143  0 - 200 mg/dL   Triglycerides 78  <150 mg/dL   HDL 60  >39 mg/dL   Total CHOL/HDL Ratio 2.4     VLDL 16  0 - 40 mg/dL   LDL Cholesterol 67  0 - 99 mg/dL    HEPARIN LEVEL (UNFRACTIONATED)     Status: None   Collection Time    06/26/13  5:25 AM      Result Value Range   Heparin Unfractionated 0.34  0.30 - 0.70 IU/mL    Intake/Output Summary (Last 24 hours) at 06/26/13 0752 Last data filed at 06/26/13 0600  Gross per 24 hour  Intake    290 ml  Output    550 ml  Net   -260 ml    ASSESSMENT AND PLAN:  NQWMI:  Troponin has trended down.   EF 25% by echo.   New cardiomyopathy per her daughter who is a nurse.  Needs cardiac cath.  She can have it this PM.  She has been off of Pradaxa with the last dose Wed night.  The patient understands that risks included but are not limited to stroke (1 in 1000), death (1 in 1000), kidney failure [usually temporary] (1 in 500), bleeding (1 in 200), allergic reaction [possibly serious] (1 in 200).  The patient understands and agrees to proceed.   Discussed with the patient and her daughter.   Anemia:  No active signs of bleeding.  No recent history of this.  Check stool guaiac. She does have small hemorrhoid.   Marie Gonzalez 06/26/2013 7:52 AM   

## 2013-06-26 NOTE — H&P (View-Only) (Signed)
SUBJECTIVE:  No further chest pain.  Anxious   PHYSICAL EXAM Filed Vitals:   06/26/13 0000 06/26/13 0032 06/26/13 0400 06/26/13 0420  BP:  110/40  115/94  Pulse: 59  59   Temp: 97.2 F (36.2 C)  97.8 F (36.6 C)   TempSrc: Oral  Axillary   Resp: 17  18   Height:      Weight:      SpO2: 95%  98%    General:  No distress Lungs:  Few basilar crackles Heart:  RRR Abdomen:  Positive bowel sounds, no rebound no guarding Extremities:  No edema Neuro:  Nofocal   LABS: Lab Results  Component Value Date   TROPONINI <0.30 06/26/2013   Results for orders placed during the hospital encounter of 06/25/13 (from the past 24 hour(s))  BASIC METABOLIC PANEL     Status: Abnormal   Collection Time    06/25/13  9:12 AM      Result Value Range   Sodium 138  135 - 145 mEq/L   Potassium 3.9  3.5 - 5.1 mEq/L   Chloride 100  96 - 112 mEq/L   CO2 27  19 - 32 mEq/L   Glucose, Bld 108 (*) 70 - 99 mg/dL   BUN 14  6 - 23 mg/dL   Creatinine, Ser 4.40  0.50 - 1.10 mg/dL   Calcium 9.7  8.4 - 34.7 mg/dL   GFR calc non Af Amer 63 (*) >90 mL/min   GFR calc Af Amer 74 (*) >90 mL/min  CBC WITH DIFFERENTIAL     Status: Abnormal   Collection Time    06/25/13  9:12 AM      Result Value Range   WBC 6.3  4.0 - 10.5 K/uL   RBC 3.58 (*) 3.87 - 5.11 MIL/uL   Hemoglobin 12.1  12.0 - 15.0 g/dL   HCT 42.5 (*) 95.6 - 38.7 %   MCV 99.2  78.0 - 100.0 fL   MCH 33.8  26.0 - 34.0 pg   MCHC 34.1  30.0 - 36.0 g/dL   RDW 56.4  33.2 - 95.1 %   Platelets 198  150 - 400 K/uL   Neutrophils Relative % 73  43 - 77 %   Neutro Abs 4.6  1.7 - 7.7 K/uL   Lymphocytes Relative 20  12 - 46 %   Lymphs Abs 1.3  0.7 - 4.0 K/uL   Monocytes Relative 6  3 - 12 %   Monocytes Absolute 0.4  0.1 - 1.0 K/uL   Eosinophils Relative 0  0 - 5 %   Eosinophils Absolute 0.0  0.0 - 0.7 K/uL   Basophils Relative 0  0 - 1 %   Basophils Absolute 0.0  0.0 - 0.1 K/uL  TROPONIN I     Status: Abnormal   Collection Time    06/25/13  9:12 AM        Result Value Range   Troponin I 1.08 (*) <0.30 ng/mL  TROPONIN I     Status: Abnormal   Collection Time    06/25/13  1:18 PM      Result Value Range   Troponin I 0.80 (*) <0.30 ng/mL  TSH     Status: None   Collection Time    06/25/13  1:18 PM      Result Value Range   TSH 4.284  0.350 - 4.500 uIU/mL  HEMOGLOBIN A1C     Status: None   Collection Time  06/25/13  1:18 PM      Result Value Range   Hemoglobin A1C 5.2  <5.7 %   Mean Plasma Glucose 103  <117 mg/dL  HEPATIC FUNCTION PANEL     Status: None   Collection Time    06/25/13  3:16 PM      Result Value Range   Total Protein 7.4  6.0 - 8.3 g/dL   Albumin 3.7  3.5 - 5.2 g/dL   AST 35  0 - 37 U/L   ALT 16  0 - 35 U/L   Alkaline Phosphatase 54  39 - 117 U/L   Total Bilirubin 0.5  0.3 - 1.2 mg/dL   Bilirubin, Direct <1.1  0.0 - 0.3 mg/dL   Indirect Bilirubin NOT CALCULATED  0.3 - 0.9 mg/dL  PROTIME-INR     Status: Abnormal   Collection Time    06/25/13  3:16 PM      Result Value Range   Prothrombin Time 15.5 (*) 11.6 - 15.2 seconds   INR 1.26  0.00 - 1.49  MRSA PCR SCREENING     Status: None   Collection Time    06/25/13  3:46 PM      Result Value Range   MRSA by PCR NEGATIVE  NEGATIVE  TROPONIN I     Status: Abnormal   Collection Time    06/25/13  7:35 PM      Result Value Range   Troponin I 0.42 (*) <0.30 ng/mL  TROPONIN I     Status: None   Collection Time    06/26/13 12:55 AM      Result Value Range   Troponin I <0.30  <0.30 ng/mL  CBC     Status: Abnormal   Collection Time    06/26/13  5:25 AM      Result Value Range   WBC 5.9  4.0 - 10.5 K/uL   RBC 3.20 (*) 3.87 - 5.11 MIL/uL   Hemoglobin 10.8 (*) 12.0 - 15.0 g/dL   HCT 91.4 (*) 78.2 - 95.6 %   MCV 100.9 (*) 78.0 - 100.0 fL   MCH 33.8  26.0 - 34.0 pg   MCHC 33.4  30.0 - 36.0 g/dL   RDW 21.3  08.6 - 57.8 %   Platelets 169  150 - 400 K/uL  BASIC METABOLIC PANEL     Status: Abnormal   Collection Time    06/26/13  5:25 AM      Result Value  Range   Sodium 133 (*) 135 - 145 mEq/L   Potassium 4.1  3.5 - 5.1 mEq/L   Chloride 99  96 - 112 mEq/L   CO2 28  19 - 32 mEq/L   Glucose, Bld 107 (*) 70 - 99 mg/dL   BUN 18  6 - 23 mg/dL   Creatinine, Ser 4.69  0.50 - 1.10 mg/dL   Calcium 9.5  8.4 - 62.9 mg/dL   GFR calc non Af Amer 61 (*) >90 mL/min   GFR calc Af Amer 70 (*) >90 mL/min  LIPID PANEL     Status: None   Collection Time    06/26/13  5:25 AM      Result Value Range   Cholesterol 143  0 - 200 mg/dL   Triglycerides 78  <528 mg/dL   HDL 60  >41 mg/dL   Total CHOL/HDL Ratio 2.4     VLDL 16  0 - 40 mg/dL   LDL Cholesterol 67  0 - 99 mg/dL  HEPARIN LEVEL (UNFRACTIONATED)     Status: None   Collection Time    06/26/13  5:25 AM      Result Value Range   Heparin Unfractionated 0.34  0.30 - 0.70 IU/mL    Intake/Output Summary (Last 24 hours) at 06/26/13 9604 Last data filed at 06/26/13 0600  Gross per 24 hour  Intake    290 ml  Output    550 ml  Net   -260 ml    ASSESSMENT AND PLAN:  NQWMI:  Troponin has trended down.   EF 25% by echo.   New cardiomyopathy per her daughter who is a Engineer, civil (consulting).  Needs cardiac cath.  She can have it this PM.  She has been off of Pradaxa with the last dose Wed night.  The patient understands that risks included but are not limited to stroke (1 in 1000), death (1 in 1000), kidney failure [usually temporary] (1 in 500), bleeding (1 in 200), allergic reaction [possibly serious] (1 in 200).  The patient understands and agrees to proceed.   Discussed with the patient and her daughter.   Anemia:  No active signs of bleeding.  No recent history of this.  Check stool guaiac. She does have small hemorrhoid.   Rollene Rotunda 06/26/2013 7:52 AM

## 2013-06-26 NOTE — CV Procedure (Signed)
   Cardiac Catheterization Procedure Note  Name: Marie Gonzalez MRN: [id number] DOB: 1925-05-05  Procedure: Left Heart Cath, Selective Coronary Angiography, LV angiography  Indication: NSTEMI   Procedural details: Allen's test was negative on the right wrist, so right groin access was used.  The right groin was prepped, draped, and anesthetized with 1% lidocaine. Using modified Seldinger technique, a 5 French sheath was introduced into the right femoral artery. Standard Judkins catheters were used for coronary angiography and left ventriculography. Catheter exchanges were performed over a guidewire. Mynx closure device was deployed.  There were no immediate procedural complications. The patient was transferred to the post catheterization recovery area for further monitoring.  Procedural Findings: Hemodynamics:  AO 93/44 LV 93/9   Coronary angiography: Coronary dominance: right  Left mainstem: No significant disease.   Left anterior descending (LAD): No significant disease.   Left circumflex (LCx): No significant disease.   Right coronary artery (RCA): 20% proximal RCA stenosis.   Left ventriculography: Periapical severe hypokinesis, EF 35-40%.    Mynx closure.   Final Conclusions:  Based on clinical history, suspect Takotsubo cardiomyopathy.  Will keep overnight, can likely discharge in the morning.  Will need followup echo in a couple of months to check for resolution.   Marca Ancona 06/26/2013, 5:07 PM

## 2013-06-26 NOTE — Clinical Social Work Psychosocial (Addendum)
Clinical Social Work Department BRIEF PSYCHOSOCIAL ASSESSMENT 06/26/2013  Patient:  Marie Gonzalez, Marie Gonzalez     Account Number:  [id number]     Admit date:  06/25/2013  Clinical Social Worker:  Jodelle Red  Date/Time:  06/26/2013 11:50 AM  Referred by:  RN  Date Referred:  06/26/2013 Referred for  Other - See comment   Other Referral:   SUPPORT, ASSESS FOR NEEDS DUE TO MOVE   Interview type:  Patient Other interview type:   DAUGHTER, CHART REVIEW    PSYCHOSOCIAL DATA Living Status:  HUSBAND Admitted [facility] facility[facility]  Level of care:  Independent Living Primary support name:  HAROLD Barsamian Primary support relationship to patient:  SPOUSE Degree of support available:   GOOD FROM DAUGHTER    CURRENT CONCERNS Current Concerns  Other - See comment   Other Concerns:   RECENT LIFE CHANGE. Pt and spouse recently moved to retirement apt and Pt has stress and anxiety as a result.    SOCIAL WORK ASSESSMENT / PLAN CSW met with Pt and her daughter to check in and offer support due to ICU admission and recent move. Per RN, Pt recently moved to ALF but Pt lives with her husband in their own independent apartment. They provide meals. Pt plans to return there at d/c.  She reports and appears to have great support from her husband and family. She denies current anxiety or concerns.   Assessment/plan status:  No Further Intervention Required Other assessment/ plan:   Information/referral to community resources:    PATIENT'S/FAMILY'S RESPONSE TO PLAN OF CARE: Pt and daughter are coping well, deny current concerns. Pt plans to return home to indep. apartment. If Pt needs SNF or higher level of care after procedure, PT consult will be needed to arrange. CSW signing off currently.   Doreen Salvage, LCSW ICU/Stepdown Clinical Social Worker [facility] Cell [phone] Hours 8am-1200pm M-F

## 2013-06-26 NOTE — Interval H&P Note (Signed)
History and Physical Interval Note:  06/26/2013 4:37 PM  Marie Gonzalez  has presented today for surgery, with the diagnosis of cp  The various methods of treatment have been discussed with the patient and family. After consideration of risks, benefits and other options for treatment, the patient has consented to  Procedure(s): LEFT HEART CATHETERIZATION WITH CORONARY ANGIOGRAM (N/A) as a surgical intervention .  The patient's history has been reviewed, patient examined, no change in status, stable for surgery.  I have reviewed the patient's chart and labs.  Questions were answered to the patient's satisfaction.     Marie Gonzalez [employer]

## 2013-06-27 DIAGNOSIS — I422 Other hypertrophic cardiomyopathy: Secondary | ICD-10-CM

## 2013-06-27 DIAGNOSIS — D649 Anemia, unspecified: Secondary | ICD-10-CM

## 2013-06-27 LAB — CBC
MCHC: 33.6 g/dL (ref 30.0–36.0)
MCV: 99.7 fL (ref 78.0–100.0)
Platelets: 155 10*3/uL (ref 150–400)
RDW: 12.9 % (ref 11.5–15.5)
WBC: 4.4 10*3/uL (ref 4.0–10.5)

## 2013-06-27 LAB — HEPARIN LEVEL (UNFRACTIONATED): Heparin Unfractionated: <0.1 IU/mL — ABNORMAL LOW (ref 0.30–0.70)

## 2013-06-27 MED ORDER — ATORVASTATIN CALCIUM 40 MG PO TABS: 40.0000 mg | ORAL_TABLET | Freq: Every day | ORAL | Status: DC

## 2013-06-27 MED ORDER — LISINOPRIL 5 MG PO TABS: 2.5000 mg | ORAL_TABLET | Freq: Every day | ORAL | Status: AC

## 2013-06-27 MED ORDER — METOPROLOL SUCCINATE ER 25 MG PO TB24: 12.5000 mg | ORAL_TABLET | Freq: Every day | ORAL | Status: DC

## 2013-06-27 MED ORDER — ASPIRIN 81 MG PO TBEC: 81.0000 mg | DELAYED_RELEASE_TABLET | Freq: Every day | ORAL | Status: DC

## 2013-06-27 NOTE — Progress Notes (Addendum)
CARDIAC REHAB PHASE I   PRE:  Rate/Rhythm: 61 paced  BP:  Sitting: 98/33      MODE:  Ambulation: 250 ft   POST:  Rate/Rhythem: 73 paced  BP:  Sitting: 120/31  Hazle Nordmann  RN notified us that pt was s/p NSTEMI.  Chart reviewed, order entered.  Pt ambulated 250 ft with rolling walker with assist x1.  Pt had no complaints with walk and returned to bed after walk.  Pt request to wait for daughter to arrive to complete d/c education. Cathie Olden, RN, BSN   [phone]  Reviewed pt discharge education with pt and daughter.  Reviewed diet, exercise, stress relief, and restrictions.  Also discussed Cardiac Rehab Phase II; pt will do home PT for now.  Left brochure with pt for future use.  Pt and daughter and voiced understanding. Cathie Olden, RN, BSN [phone]

## 2013-06-27 NOTE — Progress Notes (Addendum)
The patient was seen and examined, and I agree with the assessment and plan as documented above. Marie Gonzalez is doing well and wishes to go home. She denies chest pain and shortness of breath. She had some mild left-sided abdominal cramping but physical exam was unrevealing (no tenderness to palpation, no ecchymoses). She lives with her husband and recently moved here from [place], [place], after having lived in the same house for 55 years. She appears to have a stress-induced cardiomyopathy (Takotsubo's), as she had no significant CAD. Her EF was 25-30% by echocardiography. She will need a f/u echo in 3 months to reassess her LV systolic function.  Because of her hypotension, I will reduce Lisinopril to 2.5 mg daily and her Metoprolol to 12.5 mg daily. She can restart her Pradaxa as well.  She had a Hgb drop to 10 from 12.4 on 7/18, but it had been 10.8 earlier on 7/18 as well. She does not appear to have had a retroperitoneal bleed. However, I recommend she have a repeat CBC on Monday, July 21, to follow her hemoglobin.  She can f/u with one of our midlevel providers in 1-2 weeks.

## 2013-06-27 NOTE — Progress Notes (Signed)
Patient: Marie Gonzalez Date of Encounter: 06/27/2013, 7:44 AM Admit date: 06/25/2013     Subjective  Ms. Frankson reports she is feeling "good" and has no complaints. She CP, SOB or palpitations.   Objective  Physical Exam: Vitals: BP 92/53  Pulse 63  Temp(Src) 97.2 F (36.2 C) (Oral)  Resp 15  Ht 5\' 1"  (1.549 m)  Wt 144 lb 10 oz (65.6 kg)  BMI 27.34 kg/m2  SpO2 95% General: Well developed, well appearing, elderly 79 year old female in no acute distress. Neck: Supple. JVD not elevated. Lungs: Clear bilaterally to auscultation without wheezes, rales, or rhonchi. Breathing is unlabored. Heart: RRR S1 S2 without murmurs, rubs, or gallops.  Abdomen: Soft, non-distended. Extremities: No clubbing or cyanosis. No edema.  Distal pedal pulses are 2+ and equal bilaterally. Right groin site intact without bleeding or hematoma. +pulse. Nontender. Neuro: Alert and oriented X 3. Moves all extremities spontaneously. No focal deficits.  Intake/Output:  Intake/Output Summary (Last 24 hours) at 06/27/13 0744 Last data filed at 06/27/13 0000  Gross per 24 hour  Intake  542.5 ml  Output   1500 ml  Net -957.5 ml    Inpatient Medications:  . aspirin EC  81 mg Oral Daily  . atorvastatin  40 mg Oral q1800  . calcium-vitamin D  1 tablet Oral BID  . gabapentin  300 mg Oral BID  . heparin  5,000 Units Subcutaneous Q8H  . lisinopril  5 mg Oral Daily  . metoprolol succinate  25 mg Oral QHS  . multivitamin with minerals  1 tablet Oral Daily  . omega-3 acid ethyl esters  1 g Oral Daily  . pantoprazole  40 mg Oral Daily  . rOPINIRole  1 mg Oral QHS  . sodium chloride  3 mL Intravenous Q12H    Labs:  Recent Labs  06/25/13 0912 06/26/13 0525 06/26/13 1851  NA 138 133*  --   K 3.9 4.1  --   CL 100 99  --   CO2 27 28  --   GLUCOSE 108* 107*  --   BUN 14 18  --   CREATININE 0.82 0.85 0.76  CALCIUM 9.7 9.5  --     Recent Labs  06/25/13 1516  AST 35  ALT 16  ALKPHOS 54    BILITOT 0.5  PROT 7.4  ALBUMIN 3.7    Recent Labs  06/25/13 0912  06/26/13 1851 06/27/13 0500  WBC 6.3  < > 5.3 4.4  NEUTROABS 4.6  --   --   --   HGB 12.1  < > 12.4 10.0*  HCT 35.5*  < > 36.0 29.8*  MCV 99.2  < > 98.9 99.7  PLT 198  < > 162 155  < > = values in this interval not displayed.  Recent Labs  06/25/13 0912 06/25/13 1318 06/25/13 1935 06/26/13 0055  TROPONINI 1.08* 0.80* 0.42* <0.30    Recent Labs  06/25/13 1318  HGBA1C 5.2    Recent Labs  06/26/13 0525  CHOL 143  HDL 60  LDLCALC 67  TRIG 78  CHOLHDL 2.4    Recent Labs  06/25/13 1318  TSH 4.284    Recent Labs  06/25/13 1516  INR 1.26    Radiology/Studies: Dg Chest Portable 1 View  06/25/2013   *RADIOLOGY REPORT*  Clinical Data: Chest pain  PORTABLE CHEST - 1 VIEW  Comparison: None  Findings: There is a left chest wall pacer device with lead in the right  ventricle.  There is mild cardiac enlargement.  Pulmonary vascular congestion is noted bilaterally.  No airspace consolidation.  IMPRESSION:  1.  Mild cardiac enlargement and pulmonary vascular congestion.   Original Report Authenticated By: Signa Kell, M.D.    Cardiac catheterization: Procedural Findings:  Hemodynamics:  AO 93/44  LV 93/9  Coronary angiography:  Coronary dominance: right  Left mainstem: No significant disease.  Left anterior descending (LAD): No significant disease.  Left circumflex (LCx): No significant disease.  Right coronary artery (RCA): 20% proximal RCA stenosis.  Left ventriculography: Periapical severe hypokinesis, EF 35-40%.  Mynx closure.  Final Conclusions: Based on clinical history, suspect Takotsubo cardiomyopathy. Will keep overnight, can likely discharge in the morning. Will need followup echo in a couple of months to check for resolution.    Telemetry: V paced    Assessment and Plan  1. Probable Takotsubo CM - now on metoprolol succinate and lisinopril; may need to down-titrate if further  hypotension; will ambulate this AM with assistance 2. Anemia - Hgb dropped 2 pts in <24hrs, ? Etiology; Pradaxa held for cath, currently on heparin 3. Tachy-brady syndrome s/p PPM implant 2012 4. Atrial fibrillation  Dr. Purvis Sheffield to see Signed, Rick Duff PA-C

## 2013-06-27 NOTE — Progress Notes (Addendum)
   CARE MANAGEMENT NOTE 06/27/2013  Patient:  Marie Gonzalez, Marie Gonzalez   Account Number:  [id number]  Date Initiated:  06/27/2013  Documentation initiated by:  [facility]  Subjective/Objective Assessment:   MI     Action/Plan:   just move to [facility] with husband. Radene Knee # [phone]   Anticipated DC Date:  06/27/2013   Anticipated DC Plan:  HOME W HOME HEALTH SERVICES      DC Planning Services  CM consult      [facility] Choice  HOME HEALTH   Choice offered to / List presented to:  C-4 Adult Children        HH arranged  HH-2 PT      HH agency  [facility]   Status of service:  Completed, signed off Medicare Important Message given?   (If response is "NO", the following Medicare IM given date fields will be blank) Date Medicare IM given:   Date Additional Medicare IM given:    Discharge Disposition:  HOME W HOME HEALTH SERVICES  Per UR Regulation:    If discussed at Long Length of Stay Meetings, dates discussed:    Comments:  06/27/2013 1600 NCM contacted pt and left voice message. Received call back from dtr, Emiliano Dyer # [phone]. Offered choice for [facility]. Dtr agreeable to [facility] for [facility]. Wants agency to contact her to arrange [facility] for pt. Pt has RW at home. She is requesting Rx for Xanax for pt. Explained NCM will contact attending PA to give her a call about medication. Notified Brooke, [place] and she will follow up with dtr. Referral called to [facility] rep for [facility] PT/RN.  Isidoro Donning RN CCM Case Mgmt phone [phone]

## 2013-06-27 NOTE — Discharge Summary (Signed)
CARDIOLOGY DISCHARGE SUMMARY    Patient ID: Marie Gonzalez,  MRN: [id number], DOB/AGE: January 01, 1925 79 y.o.  Admit date: 06/25/2013 Discharge date: 06/27/2013  Primary Care Physician: Recently moved to area Primary Cardiologist: New to [place], [place]  Primary Discharge Diagnosis:  1. NSTEMI 2. Probable Takotsubo CM, EF 35-40% 3. Anemia   Secondary Discharge Diagnoses:  1. AF 2. Tachy-brady syndrome s/p PPM implant ([employer]) 3. GERD  Procedures This Admission:  1. Cardiac catheterization  Procedural Findings:  Hemodynamics:  AO 93/44  LV 93/9  Coronary angiography:  Coronary dominance: right  Left mainstem: No significant disease.  Left anterior descending (LAD): No significant disease.  Left circumflex (LCx): No significant disease.  Right coronary artery (RCA): 20% proximal RCA stenosis.  Left ventriculography: Periapical severe hypokinesis, EF 35-40%.  Mynx closure.  Final Conclusions: Based on clinical history, suspect Takotsubo cardiomyopathy. Will keep overnight, can likely discharge in the morning. Will need followup echo in a couple of months to check for resolution.  2. 2-D echocardiogram Study Conclusions - Procedure narrative: Transthoracic echocardiography. Image quality was adequate. The study was technically difficult. - Left ventricle: Distal anterior wall apical septal and inferior wall hypokinesis The cavity size was moderately dilated. Wall thickness was increased in a pattern of mild LVH. Systolic function was severely reduced. The estimated ejection fraction was in the range of 25% to 30%. - Aortic valve: Mild regurgitation. - Mitral valve: Mild to moderate regurgitation. - Left atrium: The atrium was mildly dilated. - Right atrium: The atrium was mildly dilated. - Atrial septum: No defect or patent foramen ovale was identified. - Tricuspid valve: Moderate regurgitation. - Pulmonary arteries: PA peak pressure: 64mm Hg (S).  History and  Hospital Course:  Marie Gonzalez is an 79 year old woman with tachy-brady syndrome s/p PPM implant, AF and GERD who presented to [facility] ED with chest pain on 06/25/2013. She ruled in for NSTEMI with troponin of 1.08. She was then transferred to [facility] for further evaluation. Pradaxa was held. She underwent cardiac catheterization on 06/26/2013, with details as outlined above. She did not have any significant CAD. Her LVEF is 35-40%, suspected Takotsubo CM. She was continued on appropriate medical therapy. However, due to hypotension, her lisinopril and metoprolol doses were down-titrated. She remains hemodynamically stable. Her right groin site is intact without bleeding or hematoma. She has been ambulating with a walker in the hallway. She has been seen, examined and deemed stable for discharge today by Dr. Prentice Docker. She will be discharged with Home Health RN and PT.  Discharge Vitals: Blood pressure 92/53, pulse 63, temperature 97.2 F (36.2 C), temperature source Oral, resp. rate 15, height 5\' 1"  (1.549 m), weight 144 lb 10 oz (65.6 kg), SpO2 95.00%.   Labs: Lab Results  Component Value Date   WBC 4.4 06/27/2013   HGB 10.0* 06/27/2013   HCT 29.8* 06/27/2013   MCV 99.7 06/27/2013   PLT 155 06/27/2013    Recent Labs Lab 06/25/13 1516 06/26/13 0525 06/26/13 1851  NA  --  133*  --   K  --  4.1  --   CL  --  99  --   CO2  --  28  --   BUN  --  18  --   CREATININE  --  0.85 0.76  CALCIUM  --  9.5  --   PROT 7.4  --   --   BILITOT 0.5  --   --   ALKPHOS 54  --   --  ALT 16  --   --   AST 35  --   --   GLUCOSE  --  107*  --    Lab Results  Component Value Date   TROPONINI <0.30 06/26/2013    Lab Results  Component Value Date   CHOL 143 06/26/2013   Lab Results  Component Value Date   HDL 60 06/26/2013   Lab Results  Component Value Date   LDLCALC 67 06/26/2013   Lab Results  Component Value Date   TRIG 78 06/26/2013   Lab Results  Component Value Date   CHOLHDL 2.4 06/26/2013    Recent Labs  06/25/13 1516  INR 1.26    Disposition:  The patient is being discharged in stable condition.  Follow-up: Follow-up Information   Follow up with Rick Duff, PA-C On 07/09/2013. (At 3:30 PM)    Contact information:   [facility] [address] [place] [address] [phone]      Follow up with [facility] On 06/29/2013. (At 11:20 AM for repeat CBC)    Contact information:   [facility] [address] [place] [address] [phone]     Discharge Medications:    Medication List         aspirin 81 MG EC tablet  Take 1 tablet (81 mg total) by mouth daily.     atorvastatin 40 MG tablet  Commonly known as:  LIPITOR  Take 1 tablet (40 mg total) by mouth daily at 6 PM.     Biotin 3 MG Tabs  Take 6 mg by mouth daily.     CALCIUM 600 + D PO  Take 600 mg by mouth 2 (two) times daily.     dabigatran 150 MG Caps  Commonly known as:  PRADAXA  Take 150 mg by mouth every 12 (twelve) hours.     fish oil-omega-3 fatty acids 1000 MG capsule  Take 1 g by mouth daily.     gabapentin 300 MG capsule  Commonly known as:  NEURONTIN  Take 300 mg by mouth 2 (two) times daily.     lisinopril 5 MG tablet  Commonly known as:  PRINIVIL,ZESTRIL  Take 0.5 tablets (2.5 mg total) by mouth daily.     metoprolol succinate 25 MG 24 hr tablet  Commonly known as:  TOPROL-XL  Take 0.5 tablets (12.5 mg total) by mouth at bedtime.     multivitamin with minerals tablet  Take 1 tablet by mouth daily.     omeprazole 20 MG capsule  Commonly known as:  PRILOSEC  Take 20 mg by mouth daily.     rOPINIRole 1 MG tablet  Commonly known as:  REQUIP  Take 1 mg by mouth at bedtime.     vitamin E 1000 UNIT capsule  Take 1,000 Units by mouth 2 (two) times daily.       Duration of Discharge Encounter: Greater than 30 minutes including physician time.  Signed, Rick Duff, PA-C 06/27/2013, 2:42 PM

## 2013-06-28 ENCOUNTER — Other Ambulatory Visit: Payer: Self-pay | Admitting: Cardiology

## 2013-06-28 DIAGNOSIS — E785 Hyperlipidemia, unspecified: Secondary | ICD-10-CM

## 2013-06-28 MED ORDER — ATORVASTATIN CALCIUM 40 MG PO TABS: 40.0000 mg | ORAL_TABLET | Freq: Every day | ORAL | Status: DC

## 2013-06-29 ENCOUNTER — Other Ambulatory Visit: Payer: Self-pay | Admitting: *Deleted

## 2013-06-29 ENCOUNTER — Other Ambulatory Visit: Payer: MEDICARE

## 2013-06-29 ENCOUNTER — Other Ambulatory Visit (INDEPENDENT_AMBULATORY_CARE_PROVIDER_SITE_OTHER): Payer: Medicare Other

## 2013-06-29 DIAGNOSIS — D649 Anemia, unspecified: Secondary | ICD-10-CM

## 2013-06-29 LAB — CBC
Hemoglobin: 11 g/dL — ABNORMAL LOW (ref 12.0–15.0)
MCHC: 33.8 g/dL (ref 30.0–36.0)
RDW: 12.9 % (ref 11.5–14.6)

## 2013-06-30 ENCOUNTER — Telehealth: Payer: Self-pay

## 2013-06-30 ENCOUNTER — Other Ambulatory Visit: Payer: Self-pay | Admitting: Cardiology

## 2013-06-30 DIAGNOSIS — E785 Hyperlipidemia, unspecified: Secondary | ICD-10-CM

## 2013-06-30 MED ORDER — ATORVASTATIN CALCIUM 40 MG PO TABS: 40.0000 mg | ORAL_TABLET | Freq: Every day | ORAL | Status: DC

## 2013-06-30 NOTE — Telephone Encounter (Signed)
Refill for atorvastatin 40mg  sent to [facility] completed

## 2013-07-06 ENCOUNTER — Other Ambulatory Visit: Payer: Self-pay | Admitting: *Deleted

## 2013-07-08 ENCOUNTER — Other Ambulatory Visit: Payer: Self-pay | Admitting: *Deleted

## 2013-07-08 DIAGNOSIS — D649 Anemia, unspecified: Secondary | ICD-10-CM

## 2013-07-09 ENCOUNTER — Other Ambulatory Visit: Payer: Medicare Other

## 2013-07-09 ENCOUNTER — Encounter: Payer: Self-pay | Admitting: Cardiology

## 2013-07-09 ENCOUNTER — Ambulatory Visit (INDEPENDENT_AMBULATORY_CARE_PROVIDER_SITE_OTHER): Payer: Medicare Other | Admitting: Cardiology

## 2013-07-09 VITALS — BP 103/60 | HR 64 | Ht 61.0 in | Wt 145.0 lb

## 2013-07-09 DIAGNOSIS — I428 Other cardiomyopathies: Secondary | ICD-10-CM

## 2013-07-09 DIAGNOSIS — I5181 Takotsubo syndrome: Secondary | ICD-10-CM

## 2013-07-09 DIAGNOSIS — I5022 Chronic systolic (congestive) heart failure: Secondary | ICD-10-CM

## 2013-07-09 DIAGNOSIS — E785 Hyperlipidemia, unspecified: Secondary | ICD-10-CM

## 2013-07-09 DIAGNOSIS — R001 Bradycardia, unspecified: Secondary | ICD-10-CM

## 2013-07-09 DIAGNOSIS — D649 Anemia, unspecified: Secondary | ICD-10-CM

## 2013-07-09 DIAGNOSIS — I498 Other specified cardiac arrhythmias: Secondary | ICD-10-CM

## 2013-07-09 DIAGNOSIS — I4891 Unspecified atrial fibrillation: Secondary | ICD-10-CM

## 2013-07-09 DIAGNOSIS — Z950 Presence of cardiac pacemaker: Secondary | ICD-10-CM

## 2013-07-09 LAB — PACEMAKER DEVICE OBSERVATION
BRDY-0002RV: 60 {beats}/min
VENTRICULAR PACING PM: 95.9

## 2013-07-09 MED ORDER — ATORVASTATIN CALCIUM 10 MG PO TABS: 10.0000 mg | ORAL_TABLET | Freq: Every day | ORAL | Status: DC

## 2013-07-09 NOTE — Progress Notes (Signed)
ELECTROPHYSIOLOGY OFFICE NOTE  Patient ID: Marie Gonzalez MRN: [id number], DOB/AGE: [birth year]   Date of Visit: 07/09/2013  Primary Physician: No primary provider on file. Primary Cardiologist / EP: New to [facility]; will be Marie Emms, MD / Allred, MD Reason for Visit: Hospital follow-up  History of Present Illness  Marie Gonzalez is an 79 year old woman with tachy-brady syndrome s/p PPM implant (2012 in [place]), AF and GERD who presented to [facility] ED with chest pain on 06/25/2013. She ruled in for NSTEMI with troponin of 1.08. She was then transferred to [facility] for further evaluation. Pradaxa was held. She underwent cardiac catheterization on 06/26/2013, with details as outlined bvlow. She did not have any significant CAD. Her LVEF is 35-40%, suspected Takotsubo CM. She was continued on appropriate medical therapy. However, due to hypotension, her lisinopril and metoprolol doses were down-titrated. She was discharged to home on 06/27/2013. She presents today for hospital follow-up. She is accompanied by her sister.  Since discharge, she reports she is doing well and has no complaints. She denies chest pain or shortness of breath. She denies palpitations, dizziness, near syncope or syncope. She denies LE swelling, orthopnea, PND or recent weight gain. She is compliant and tolerating medications without difficulty.  Past Medical History Past Medical History  Diagnosis Date  . Hypertension   . Sick sinus syndrome     Pacemaker (Medtronic) - VVIR placed 2012 in [place], [place] & was put on Pradaxa  . GERD (gastroesophageal reflux disease)   . A-fib     Confirmed with pt's cardiology office - Holter with tachybrady -> went on to have pacemaker  . Anemia     Prior h/o such, per [facility] cardiologist's office    Past Surgical History Past Surgical History  Procedure Laterality Date  . Pacemaker insertion      2012  . Appendectomy    . Hernia repair    . Abdominal hysterectomy      partial  .  Tonsillectomy    . Breast cyst excision    . Rectocele repair      In 2000 - had complications from surgery - abdominal organs couldn't function, in critical condition & in hospital for 2 weeks    Allergies/Intolerances Allergies  Allergen Reactions  . Sulfa Antibiotics Rash   Current Home Medications Current Outpatient Prescriptions  Medication Sig Dispense Refill  . aspirin EC 81 MG EC tablet Take 1 tablet (81 mg total) by mouth daily.      Marland Kitchen atorvastatin (LIPITOR) 10 MG tablet Take 1 tablet (10 mg total) by mouth daily.  30 tablet  2  . Biotin 3 MG TABS Take 6 mg by mouth daily.      . Calcium Carbonate-Vitamin D (CALCIUM 600 + D PO) Take 600 mg by mouth 2 (two) times daily.      . dabigatran (PRADAXA) 150 MG CAPS Take 150 mg by mouth every 12 (twelve) hours.      . fish oil-omega-3 fatty acids 1000 MG capsule Take 1,200 mg by mouth 2 (two) times daily.       Marland Kitchen gabapentin (NEURONTIN) 300 MG capsule Take 300 mg by mouth 2 (two) times daily.      Marland Kitchen lisinopril (PRINIVIL,ZESTRIL) 5 MG tablet Take 0.5 tablets (2.5 mg total) by mouth daily.      . metoprolol succinate (TOPROL-XL) 25 MG 24 hr tablet Take 0.5 tablets (12.5 mg total) by mouth at bedtime.      . Multiple Vitamins-Minerals (MULTIVITAMIN WITH MINERALS)  tablet Take 1 tablet by mouth daily.      Marland Kitchen omeprazole (PRILOSEC) 20 MG capsule Take 20 mg by mouth daily.      Marland Kitchen rOPINIRole (REQUIP) 1 MG tablet Take 1 mg by mouth at bedtime.      . vitamin E 1000 UNIT capsule Take 1,000 Units by mouth 2 (two) times daily.       No current facility-administered medications for this visit.   Social History Social History  . Marital Status: Married   Social History Main Topics  . Smoking status: Former Smoker -- 1.00 packs/day for 25 years  . Smokeless tobacco: Not on file  . Alcohol Use: No  . Drug Use: No   Review of Systems General: No chills, fever, night sweats or weight changes Cardiovascular: No chest pain, dyspnea on exertion,  edema, orthopnea, palpitations, paroxysmal nocturnal dyspnea Dermatological: No rash, lesions or masses Respiratory: No cough, dyspnea Urologic: No hematuria, dysuria Abdominal: No nausea, vomiting, diarrhea, bright red blood per rectum, melena, or hematemesis Neurologic: No visual changes, weakness, changes in mental status All other systems reviewed and are otherwise negative except as noted above.  Physical Exam Vitals: Blood pressure 103/60, pulse 64, height 5\' 1"  (1.549 m), weight 145 lb (65.772 kg).  General: Well developed, well appearing 79 y.o. female in no acute distress. HEENT: Normocephalic, atraumatic. EOMs intact. Sclera nonicteric. Oropharynx clear.  Neck: Supple. No JVD. Lungs: Respirations regular and unlabored, CTA bilaterally. No wheezes, rales or rhonchi. Heart: RRR. S1, S2 present. No murmurs, rub, S3 or S4. Abdomen: Soft, non-distended.  Extremities: No clubbing, cyanosis or edema. DP/PT/Radials 2+ and equal bilaterally. Psych: Normal affect. Neuro: Alert and oriented X 3. Moves all extremities spontaneously.   Diagnostics Device interrogation today - Normal device function. Threshold, sensing, impedance consistent with previous measurements. Device programmed to maximize longevity. No high ventricular rates noted. Device programmed at appropriate safety margins. Histogram distribution appropriate for patient activity level. Estimated longevity 8 years. Cardiac catheterization 06/26/2013 Procedural Findings:  Hemodynamics:  AO 93/44  LV 93/9  Coronary angiography:  Coronary dominance: right  Left mainstem: No significant disease.  Left anterior descending (LAD): No significant disease.  Left circumflex (LCx): No significant disease.  Right coronary artery (RCA): 20% proximal RCA stenosis.  Left ventriculography: Periapical severe hypokinesis, EF 35-40%.  Mynx closure.  Final Conclusions: Based on clinical history, suspect Takotsubo cardiomyopathy. Will keep  overnight, can likely discharge in the morning. Will need followup echo in a couple of months to check for resolution.  2. 2-D echocardiogram  Study Conclusions - Procedure narrative: Transthoracic echocardiography. Image quality was adequate. The study was technically difficult. - Left ventricle: Distal anterior wall apical septal and inferior wall hypokinesis The cavity size was moderately dilated. Wall thickness was increased in a pattern of mild LVH. Systolic function was severely reduced. The estimated ejection fraction was in the range of 25% to 30%. - Aortic valve: Mild regurgitation. - Mitral valve: Mild to moderate regurgitation. - Left atrium: The atrium was mildly dilated. - Right atrium: The atrium was mildly dilated. - Atrial septum: No defect or patent foramen ovale was identified. - Tricuspid valve: Moderate regurgitation. - Pulmonary arteries: PA peak pressure: 64mm Hg (S).   Assessment and Plan 1. Bradycardia s/p PPM implant (Oct 2012 in [place])  - Normal device function  - No programming changes made  - Return for routine device follow-up in device clinic in 6 months 2. Probable Takotsubo CM, EF 35-40%  - Continue medical therapy  -  Repeat echo in 6 weeks  - Return for follow-up in 6 weeks with Dr. Eden Gonzalez 3. Atrial fibrillation  - Stable, rate controlled  - Continue BB for rate control and Pradaxa for embolic prophylaxis 4. Anemia  - Repeat CBC today  Signed, Kaijah Abts, PA-C 07/09/2013, 5:08 PM

## 2013-07-09 NOTE — Patient Instructions (Addendum)
Your physician recommends that you schedule a follow-up appointment in: 6 WEEKS FOLLOW UP WHILE DR.NISHAN IS IN OFFICE  Your physician recommends that you continue on your current medications as directed. Please refer to the Current Medication list given to you today.  CHANGE YOUR ATORVASTATIN TO 10 MG ONCE A DAY   CHECK AND MAKE SURE YOU ARE TAKING LISINOPRIL 1/2 TABLET A DAY. CALL OFFICE TO INFORM us HOW YOU ARE TAKING IT [phone]

## 2013-07-21 ENCOUNTER — Other Ambulatory Visit: Payer: Self-pay | Admitting: Cardiology

## 2013-07-21 DIAGNOSIS — I5181 Takotsubo syndrome: Secondary | ICD-10-CM

## 2013-07-21 DIAGNOSIS — I214 Non-ST elevation (NSTEMI) myocardial infarction: Secondary | ICD-10-CM

## 2013-08-05 ENCOUNTER — Other Ambulatory Visit: Payer: Self-pay | Admitting: *Deleted

## 2013-08-05 DIAGNOSIS — E785 Hyperlipidemia, unspecified: Secondary | ICD-10-CM

## 2013-08-07 ENCOUNTER — Other Ambulatory Visit: Payer: Self-pay | Admitting: *Deleted

## 2013-08-07 DIAGNOSIS — E785 Hyperlipidemia, unspecified: Secondary | ICD-10-CM

## 2013-08-07 MED ORDER — ATORVASTATIN CALCIUM 10 MG PO TABS: 10.0000 mg | ORAL_TABLET | Freq: Every day | ORAL | Status: DC

## 2013-08-12 ENCOUNTER — Encounter: Payer: Self-pay | Admitting: Cardiovascular Disease

## 2013-08-13 ENCOUNTER — Ambulatory Visit: Payer: MEDICARE | Admitting: Internal Medicine

## 2013-08-17 ENCOUNTER — Encounter: Payer: Self-pay | Admitting: Internal Medicine

## 2013-08-20 ENCOUNTER — Telehealth: Payer: Self-pay | Admitting: Cardiovascular Disease

## 2013-08-20 NOTE — Telephone Encounter (Signed)
LMTCB 08/20/13 12:30pm/PE

## 2013-08-20 NOTE — Telephone Encounter (Signed)
New Problem  Pt's DTR needs to speak with dr. about mental status and is seeking emergency hearing for guardianship status.. time is of the essence per DTR

## 2013-08-21 ENCOUNTER — Ambulatory Visit: Payer: MEDICARE | Admitting: Cardiology

## 2013-08-26 NOTE — Telephone Encounter (Signed)
MAIL BOX IS FULL COULD NOT LEAVE MESSAGE .Zack Seal

## 2013-09-01 NOTE — Telephone Encounter (Signed)
LMTCB   LEFT MESSAGE ONCE AGAIN ,  FOR PT'S DAUGHTER ,TO CALL BACK  MESSAGE  MUST HAVE  BEEN ADDRESSED WITH ANOTHER SOURCE   UNABLE TO REACH  CALLER .Marie Gonzalez

## 2014-01-13 ENCOUNTER — Encounter: Payer: Self-pay | Admitting: Internal Medicine

## 2014-08-27 ENCOUNTER — Telehealth: Payer: Self-pay | Admitting: Internal Medicine

## 2014-08-27 ENCOUNTER — Encounter: Payer: Self-pay | Admitting: Internal Medicine

## 2014-08-27 NOTE — Telephone Encounter (Signed)
08-27-14 SENT PAST DUE LETTER FOR PACER CK WITH ALLRED, LAST CK 07-09-13/MT

## 2014-10-01 IMAGING — CR DG CHEST 1V PORT
1 series · 1 of 1 positions shown · non-contrast
Comparison: None

CLINICAL DATA: Chest pain

PORTABLE CHEST - 1 VIEW

[AP]
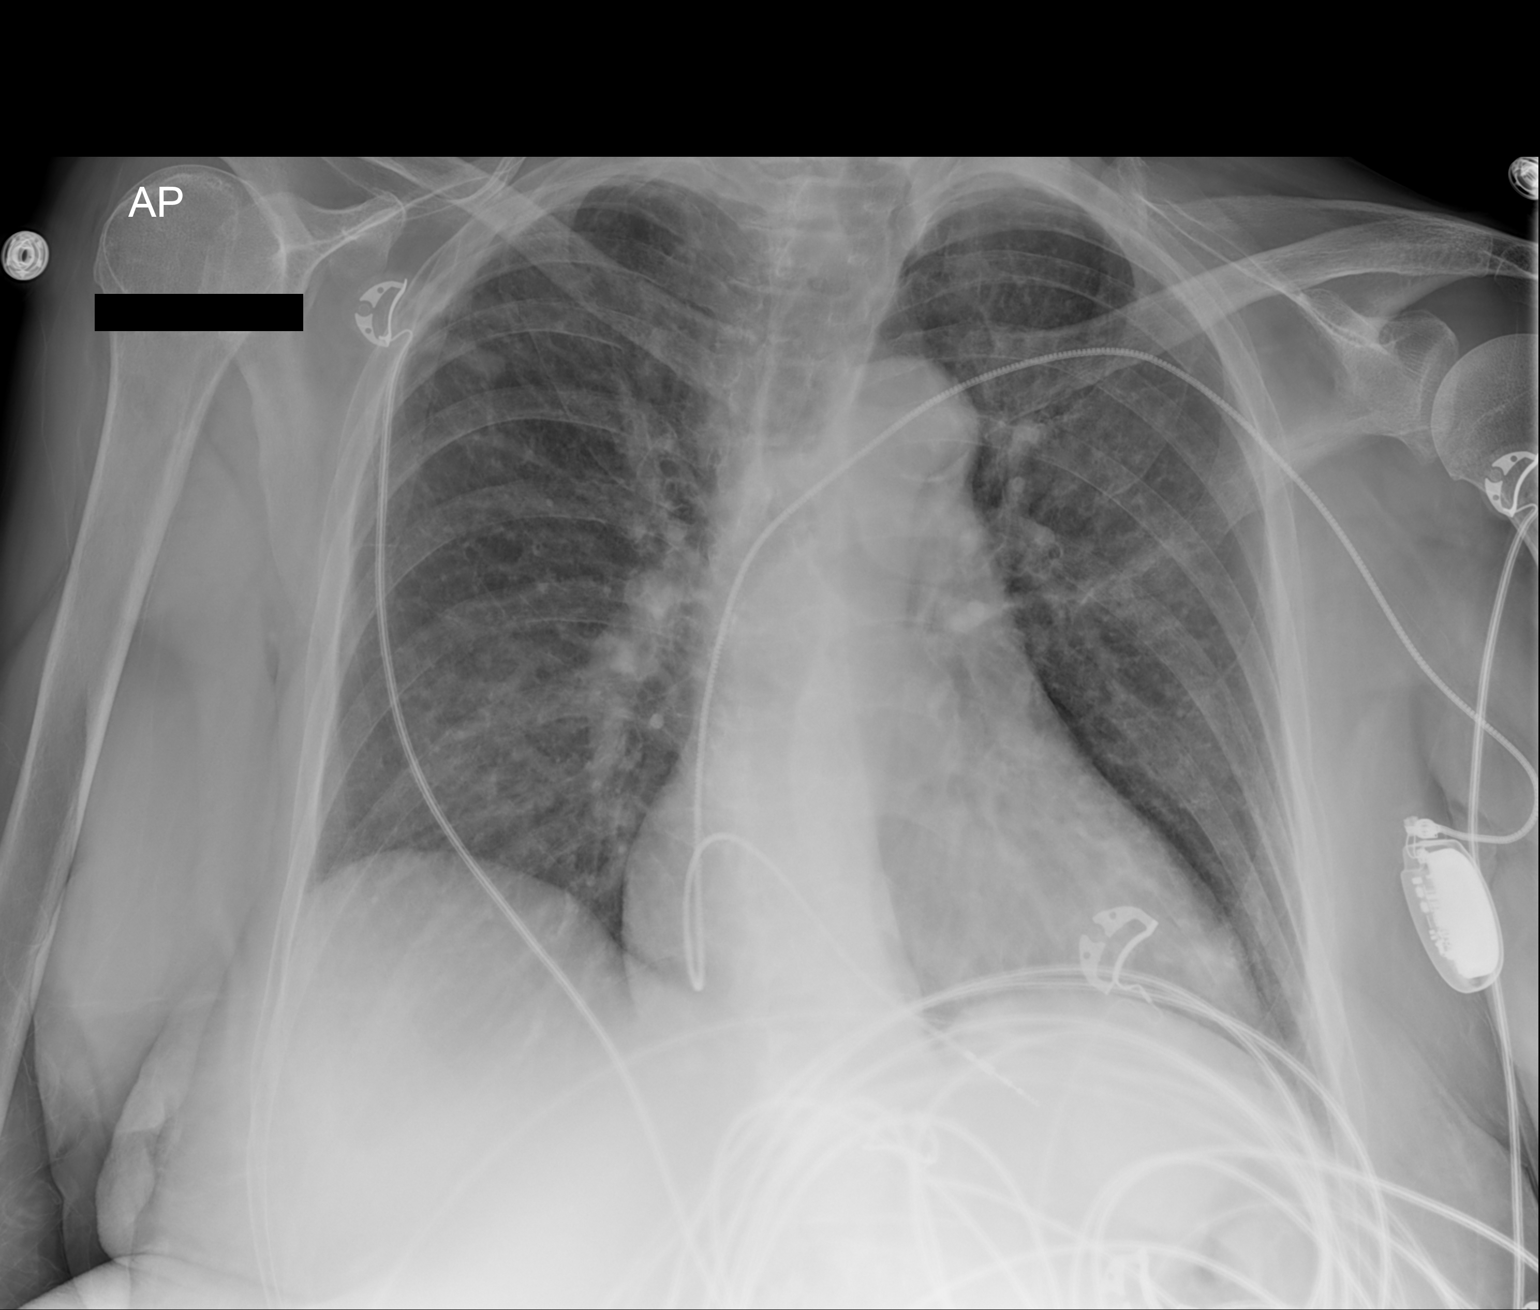

[1 of 1 positions shown; findings below may reference images not displayed]

FINDINGS: There is a left chest wall pacer device with lead in the
right ventricle.  There is mild cardiac enlargement.  Pulmonary
vascular congestion is noted bilaterally.  No airspace
consolidation.
IMPRESSION: 1.  Mild cardiac enlargement and pulmonary vascular congestion.

## 2014-11-18 ENCOUNTER — Encounter (HOSPITAL_COMMUNITY): Payer: Self-pay | Admitting: Cardiovascular Disease

## 2015-02-04 ENCOUNTER — Encounter: Payer: Self-pay | Admitting: *Deleted

## 2015-12-01 ENCOUNTER — Encounter: Payer: Self-pay | Admitting: *Deleted

## 2016-04-06 ENCOUNTER — Ambulatory Visit (INDEPENDENT_AMBULATORY_CARE_PROVIDER_SITE_OTHER): Payer: Medicare Other | Admitting: Sports Medicine

## 2016-04-06 ENCOUNTER — Encounter: Payer: Self-pay | Admitting: Sports Medicine

## 2016-04-06 DIAGNOSIS — B351 Tinea unguium: Secondary | ICD-10-CM | POA: Diagnosis not present

## 2016-04-06 DIAGNOSIS — M2141 Flat foot [pes planus] (acquired), right foot: Secondary | ICD-10-CM | POA: Diagnosis not present

## 2016-04-06 DIAGNOSIS — M79672 Pain in left foot: Secondary | ICD-10-CM | POA: Diagnosis not present

## 2016-04-06 DIAGNOSIS — L84 Corns and callosities: Secondary | ICD-10-CM | POA: Diagnosis not present

## 2016-04-06 DIAGNOSIS — M2142 Flat foot [pes planus] (acquired), left foot: Secondary | ICD-10-CM | POA: Diagnosis not present

## 2016-04-06 DIAGNOSIS — M79671 Pain in right foot: Secondary | ICD-10-CM

## 2016-04-07 NOTE — Progress Notes (Signed)
Patient ID: Marie Gonzalez, female   DOB: 12/05/1925, 82 y.o.   MRN: [id number] Subjective: Marie Gonzalez is a 82 y.o. female patient seen today in office with complaint of painful callus and thickened and elongated toenails; unable to trim. Patient denies history of Diabetes, Neuropathy, or Vascular disease. Patient is assisted by daughter this visit. [place] is [place]. Patient has no other pedal complaints at this time.   Patient Active Problem List   Diagnosis Date Noted  . Other hypertrophic cardiomyopathy (HCC) 06/27/2013  . Anemia 06/27/2013    Current Outpatient Prescriptions on File Prior to Visit  Medication Sig Dispense Refill  . aspirin EC 81 MG EC tablet Take 1 tablet (81 mg total) by mouth daily.    Marland Kitchen. atorvastatin (LIPITOR) 10 MG tablet Take 1 tablet (10 mg total) by mouth daily. 30 tablet 2  . Biotin 3 MG TABS Take 6 mg by mouth daily.    . Calcium Carbonate-Vitamin D (CALCIUM 600 + D PO) Take 600 mg by mouth 2 (two) times daily.    . dabigatran (PRADAXA) 150 MG CAPS Take 150 mg by mouth every 12 (twelve) hours.    . fish oil-omega-3 fatty acids 1000 MG capsule Take 1,200 mg by mouth 2 (two) times daily.     Marland Kitchen. gabapentin (NEURONTIN) 300 MG capsule Take 300 mg by mouth 2 (two) times daily.    Marland Kitchen. lisinopril (PRINIVIL,ZESTRIL) 5 MG tablet Take 0.5 tablets (2.5 mg total) by mouth daily.    . metoprolol succinate (TOPROL-XL) 25 MG 24 hr tablet Take 0.5 tablets (12.5 mg total) by mouth at bedtime.    . Multiple Vitamins-Minerals (MULTIVITAMIN WITH MINERALS) tablet Take 1 tablet by mouth daily.    Marland Kitchen. omeprazole (PRILOSEC) 20 MG capsule Take 20 mg by mouth daily.    Marland Kitchen. rOPINIRole (REQUIP) 1 MG tablet Take 1 mg by mouth at bedtime.    . vitamin E 1000 UNIT capsule Take 1,000 Units by mouth 2 (two) times daily.     No current facility-administered medications on file prior to visit.    Allergies  Allergen Reactions  . Sulfa Antibiotics Rash    Objective: Physical  Exam  General: Well developed, nourished, no acute distress, awake, alert and oriented x 2, dementia  Vascular: Dorsalis pedis artery 1/4 bilateral, Posterior tibial artery 1/4 bilateral, skin temperature warm to warm proximal to distal bilateral lower extremities, + varicosities, scant pedal hair present bilateral.  Neurological: Gross sensation present via light touch bilateral.   Dermatological: Skin is warm, dry, and supple bilateral, Nails 1-10 are tender, long, thick, and discolored with mild subungal debris, no webspace macerations present bilateral, no open lesions present bilateral, + callus/corns/hyperkeratotic tissue present bilateral plantar medial midfoot and right 5th toe. No signs of infection bilateral.  Musculoskeletal: Severe pes planus with TN plantar prominence boney deformities noted bilateral. Muscular strength within normal limits without pain on range of motion. No pain with calf compression bilateral.  Assessment and Plan:  Problem List Items Addressed This Visit    None    Visit Diagnoses    Corns and callosities    -  Primary    Dermatophytosis of nail        Pes planus of both feet        Foot pain, bilateral          -Examined patient.  -Discussed treatment options for painful mycotic nails and corn and callus. -Mechanically debrided corn/callus using sterile chisel blade and reduced mycotic  nails with sterile nail nipper and dremel nail file without incident. -Recommend good supportive shoes daily for foot type -Recommend daily skin emollients -Patient's daughter desires custom molded shoes for mom; Recommend Rx from [facility] however they left before I remembered to give them the Rx for shoes -Patient to return in 3 months for follow up evaluation or sooner if symptoms worsen.  Asencion Islam, DPM

## 2016-04-19 ENCOUNTER — Emergency Department
Admission: EM | Admit: 2016-04-19 | Discharge: 2016-04-20 | Disposition: A | Discharge status: Home / Self Care | Payer: Medicare Other | Attending: Emergency Medicine | Admitting: Emergency Medicine

## 2016-04-19 ENCOUNTER — Encounter: Payer: Self-pay | Admitting: Emergency Medicine

## 2016-04-19 DIAGNOSIS — Z7982 Long term (current) use of aspirin: Secondary | ICD-10-CM | POA: Insufficient documentation

## 2016-04-19 DIAGNOSIS — I10 Essential (primary) hypertension: Secondary | ICD-10-CM | POA: Insufficient documentation

## 2016-04-19 DIAGNOSIS — Z87891 Personal history of nicotine dependence: Secondary | ICD-10-CM | POA: Insufficient documentation

## 2016-04-19 DIAGNOSIS — I252 Old myocardial infarction: Secondary | ICD-10-CM | POA: Insufficient documentation

## 2016-04-19 DIAGNOSIS — Z950 Presence of cardiac pacemaker: Secondary | ICD-10-CM | POA: Insufficient documentation

## 2016-04-19 DIAGNOSIS — R1030 Lower abdominal pain, unspecified: Secondary | ICD-10-CM | POA: Diagnosis not present

## 2016-04-19 DIAGNOSIS — Z79899 Other long term (current) drug therapy: Secondary | ICD-10-CM | POA: Diagnosis not present

## 2016-04-19 DIAGNOSIS — F329 Major depressive disorder, single episode, unspecified: Secondary | ICD-10-CM | POA: Diagnosis not present

## 2016-04-19 DIAGNOSIS — M810 Age-related osteoporosis without current pathological fracture: Secondary | ICD-10-CM | POA: Insufficient documentation

## 2016-04-19 DIAGNOSIS — I4891 Unspecified atrial fibrillation: Secondary | ICD-10-CM | POA: Insufficient documentation

## 2016-04-19 DIAGNOSIS — R350 Frequency of micturition: Secondary | ICD-10-CM | POA: Diagnosis present

## 2016-04-19 DIAGNOSIS — I422 Other hypertrophic cardiomyopathy: Secondary | ICD-10-CM | POA: Diagnosis not present

## 2016-04-19 HISTORY — DX: Major depressive disorder, single episode, unspecified: F32.9

## 2016-04-19 HISTORY — DX: Depression, unspecified: F32.A

## 2016-04-19 HISTORY — DX: Unspecified dementia, unspecified severity, without behavioral disturbance, psychotic disturbance, mood disturbance, and anxiety: F03.90

## 2016-04-19 HISTORY — DX: Anxiety disorder, unspecified: F41.9

## 2016-04-19 HISTORY — DX: Acute myocardial infarction, unspecified: I21.9

## 2016-04-19 HISTORY — DX: Unspecified atrial fibrillation: I48.91

## 2016-04-19 HISTORY — DX: Age-related osteoporosis without current pathological fracture: M81.0

## 2016-04-19 NOTE — ED Notes (Signed)
EMS states they were called out for leg pain but upon arrival she was complaining of pain to bladder. Pt states she has had leg pain off and on for a while. Pt states she has had urinary frequency and burning with urination for last couple of days.

## 2016-04-20 DIAGNOSIS — R1030 Lower abdominal pain, unspecified: Secondary | ICD-10-CM | POA: Diagnosis not present

## 2016-04-20 LAB — URINALYSIS COMPLETE WITH MICROSCOPIC (ARMC ONLY)
BACTERIA UA: NONE SEEN
BILIRUBIN URINE: NEGATIVE
GLUCOSE, UA: NEGATIVE mg/dL
Hgb urine dipstick: NEGATIVE
Ketones, ur: NEGATIVE mg/dL
NITRITE: NEGATIVE
Protein, ur: NEGATIVE mg/dL
SQUAMOUS EPITHELIAL / LPF: NONE SEEN
Specific Gravity, Urine: 1.005 (ref 1.005–1.030)
pH: 6 (ref 5.0–8.0)

## 2016-04-20 MED ORDER — CEPHALEXIN 500 MG PO CAPS: 500.0000 mg | ORAL_CAPSULE | Freq: Two times a day (BID) | ORAL | Status: DC

## 2016-04-20 NOTE — Discharge Instructions (Signed)

## 2016-04-20 NOTE — ED Notes (Signed)
Pt requested she want to leave and does not want anymore test.

## 2016-04-20 NOTE — ED Provider Notes (Signed)
[facility] Emergency Department Provider Note  ____________________________________________  Time seen: 12:00 AM  I have reviewed the triage vital signs and the nursing notes.   HISTORY  Chief Complaint Urinary Frequency    HPI Marie BirkenheadJoyce Gonzalez is a 82 y.o. female who complains of suprapubic pain and urinary frequency since this afternoon. It is intermittent. Mild. No fevers chills or other complaints. Eating and drinking normally.  Patient states she does not want to stay here for a long workup. She is amenable to urine testing but otherwise prefers to go follow up with her doctor for further evaluation.     Past Medical History  Diagnosis Date  . Hypertension   . Sick sinus syndrome [facility](HCC)     Pacemaker (Medtronic) - V[place]IR placed 2012 in [place] & was put on Pradaxa  . GERD (gastroesophageal reflux disease)   . A-fib (HCC)     Confirmed with pt's cardiology office - Holter with tachybrady -> went on to have pacemaker  . Anemia     Prior h/o such, per [facility] cardiologist's office  . Dementia   . Osteoporosis   . Myocardial infarction (HCC)   . Atrial fibrillation (HCC)   . Anxiety   . Depression      Patient Active Problem List   Diagnosis Date Noted  . Other hypertrophic cardiomyopathy (HCC) 06/27/2013  . Anemia 06/27/2013     Past Surgical History  Procedure Laterality Date  . Pacemaker insertion      2012  . Appendectomy    . Hernia repair    . Abdominal hysterectomy      partial  . Tonsillectomy    . Breast cyst excision    . Rectocele repair      In 2000 - had complications from surgery - abdominal organs couldn't function, in critical condition & in hospital for 2 weeks  . Left heart catheterization with coronary angiogram N/A 06/26/2013    Procedure: LEFT HEART CATHETERIZATION WITH CORONARY ANGIOGRAM;  Surgeon: Kathleene Hazelhristopher D McAlhany, MD;  [facility] LAB;  Service: Cardiovascular;  Laterality: N/A;     Current  Outpatient Rx  Name  Route  Sig  Dispense  Refill  . aspirin 325 MG tablet   Oral   Take 325 mg by mouth daily.         Marland Kitchen. gabapentin (NEURONTIN) 300 MG capsule   Oral   Take 300-600 mg by mouth 2 (two) times daily. Take 600 mg in the morning,  300 mg in the afternoon and 600 mg at bedtime         . lisinopril (PRINIVIL,ZESTRIL) 5 MG tablet   Oral   Take 0.5 tablets (2.5 mg total) by mouth daily.         . Melatonin 1 MG TABS   Oral   Take 1 tablet by mouth daily as needed.         . memantine (NAMENDA) 5 MG tablet   Oral   Take 5 mg by mouth daily.         . metoprolol succinate (TOPROL-XL) 25 MG 24 hr tablet   Oral   Take 0.5 tablets (12.5 mg total) by mouth at bedtime.         . Multiple Vitamins-Minerals (MULTIVITAMIN WITH MINERALS) tablet   Oral   Take 1 tablet by mouth daily.         Marland Kitchen. omeprazole (PRILOSEC) 20 MG capsule   Oral   Take 20 mg by mouth  daily.         . oxybutynin (DITROPAN-XL) 5 MG 24 hr tablet   Oral   Take 5 mg by mouth at bedtime.         . Skin Protectants, Misc. (EUCERIN) cream   Topical   Apply 1 application topically as needed for dry skin.         . cephALEXin (KEFLEX) 500 MG capsule   Oral   Take 1 capsule (500 mg total) by mouth 2 (two) times daily.   14 capsule   0      Allergies Sulfa antibiotics   Family History  Problem Relation Age of Onset  . Heart attack Son     Died at 25 of MI  . Heart attack Mother     Died at 66 of MI    Social History Social History  Substance Use Topics  . Smoking status: Former Smoker -- 1.00 packs/day for 25 years  . Smokeless tobacco: None  . Alcohol Use: No    Review of Systems  Constitutional:   No fever or chills.  Eyes:   No vision changes.  ENT:   No sore throat. No rhinorrhea. Cardiovascular:   No chest pain. Respiratory:   No dyspnea or cough. Gastrointestinal:   Negative for abdominal pain, vomiting and diarrhea.  Genitourinary:   Positive dysuria  and urinary frequency.  Musculoskeletal:   Chronic leg pain, unchanged from baseline Neurological:   Negative for headaches 10-point ROS otherwise negative.  ____________________________________________   PHYSICAL EXAM:  VITAL SIGNS: ED Triage Vitals  Enc Vitals Group     BP 04/19/16 2305 133/51 mmHg     Pulse Rate 04/19/16 2305 61     Resp 04/19/16 2305 18     Temp 04/19/16 2305 98.3 F (36.8 C)     Temp Source 04/19/16 2305 Oral     SpO2 04/19/16 2305 97 %     Weight 04/19/16 2305 160 lb (72.576 kg)     Height 04/19/16 2305  (1.651 m)     Head Cir --      Peak Flow --      Pain Score 04/19/16 2317 0     Pain Loc --      Pain Edu? --      Excl. in GC? --     Vital signs reviewed, nursing assessments reviewed.   Constitutional:   Alert and oriented to person and place. Well appearing and in no distress. Eyes:   No scleral icterus. No conjunctival pallor. PERRL. EOMI.  No nystagmus. ENT   Head:   Normocephalic and atraumatic.   Nose:   No congestion/rhinnorhea. No septal hematoma   Mouth/Throat:   MMM, no pharyngeal erythema. No peritonsillar mass.    Neck:   No stridor. No SubQ emphysema. No meningismus. Hematological/Lymphatic/Immunilogical:   No cervical lymphadenopathy. Cardiovascular:   RRR. Symmetric bilateral radial and DP pulses.  No murmurs.  Respiratory:   Normal respiratory effort without tachypnea nor retractions. Breath sounds are clear and equal bilaterally. No wheezes/rales/rhonchi. Gastrointestinal:   Soft with mild generalized abdominal tenderness, worse in the suprapubic area. Non distended. There is no CVA tenderness.  No rebound, rigidity, or guarding. Not consistent with peritonitis Genitourinary:   deferred Musculoskeletal:   Nontender with normal range of motion in all extremities. No joint effusions.  No lower extremity tenderness.  No edema. Neurologic:   Normal speech and language.  CN 2-10 normal. Motor grossly intact. No  gross focal  neurologic deficits are appreciated.  Skin:    Skin is warm, dry and intact. No rash noted.  No petechiae, purpura, or bullae.  ____________________________________________    LABS (pertinent positives/negatives) (all labs ordered are listed, but only abnormal results are displayed) Labs Reviewed  URINALYSIS COMPLETEWITH MICROSCOPIC ([facility] ONLY) - Abnormal; Notable for the following:    Color, Urine STRAW (*)    APPearance CLEAR (*)    Leukocytes, UA TRACE (*)    All other components within normal limits  URINE CULTURE   ____________________________________________   EKG    ____________________________________________    RADIOLOGY    ____________________________________________   PROCEDURES   ____________________________________________   INITIAL IMPRESSION / ASSESSMENT AND PLAN / ED COURSE  Pertinent labs & imaging results that were available during my care of the patient were reviewed by me and considered in my medical decision making (see chart for details).  Patient very well-appearing no acute distress, calm and comfortable and pleasant to interact with. Despite her history of dementia she gives a very coherent and reliable history and participates appropriately in exam. high suspicion for urinary tract infection.  Low suspicion for appendicitis AAA biliary disease perforation obstruction or abscess. We'll check urine. If negative, will further discuss with the patient regarding more detailed workup.   ----------------------------------------- 1:43 AM on 04/20/2016 -----------------------------------------  Urine is unremarkable. Patient still refuses any further testing. Labs stable and unremarkable. Based on her symptoms we'll treat her for cystitis. Patient has medical decision-making capacity despite her history of dementia, so we will discharge her home. She states she can follow up with her primary care doctor tomorrow. Also start her on  Keflex for now, urine culture pending.    ____________________________________________   FINAL CLINICAL IMPRESSION(S) / ED DIAGNOSES  Final diagnoses:  Suprapubic pain, acute, unspecified laterality       Portions of this note were generated with dragon dictation software. Dictation errors may occur despite best attempts at proofreading.   Sharman Cheek, MD 04/20/16 [phone]

## 2016-04-21 LAB — URINE CULTURE

## 2016-07-06 ENCOUNTER — Ambulatory Visit: Payer: Medicare Other | Admitting: Sports Medicine

## 2016-08-15 ENCOUNTER — Emergency Department: Payer: Medicare Other

## 2016-08-15 ENCOUNTER — Encounter: Payer: Self-pay | Admitting: Emergency Medicine

## 2016-08-15 ENCOUNTER — Emergency Department
Admission: EM | Admit: 2016-08-15 | Discharge: 2016-08-15 | Disposition: A | Discharge status: Home / Self Care | Payer: Medicare Other | Attending: Emergency Medicine | Admitting: Emergency Medicine

## 2016-08-15 DIAGNOSIS — M79602 Pain in left arm: Secondary | ICD-10-CM

## 2016-08-15 DIAGNOSIS — Z87891 Personal history of nicotine dependence: Secondary | ICD-10-CM | POA: Insufficient documentation

## 2016-08-15 DIAGNOSIS — N390 Urinary tract infection, site not specified: Secondary | ICD-10-CM | POA: Insufficient documentation

## 2016-08-15 DIAGNOSIS — I422 Other hypertrophic cardiomyopathy: Secondary | ICD-10-CM | POA: Insufficient documentation

## 2016-08-15 DIAGNOSIS — Z79899 Other long term (current) drug therapy: Secondary | ICD-10-CM | POA: Insufficient documentation

## 2016-08-15 DIAGNOSIS — M79605 Pain in left leg: Secondary | ICD-10-CM | POA: Insufficient documentation

## 2016-08-15 DIAGNOSIS — R002 Palpitations: Secondary | ICD-10-CM | POA: Insufficient documentation

## 2016-08-15 DIAGNOSIS — Z950 Presence of cardiac pacemaker: Secondary | ICD-10-CM | POA: Insufficient documentation

## 2016-08-15 DIAGNOSIS — G8929 Other chronic pain: Secondary | ICD-10-CM | POA: Diagnosis not present

## 2016-08-15 DIAGNOSIS — F0390 Unspecified dementia without behavioral disturbance: Secondary | ICD-10-CM | POA: Diagnosis not present

## 2016-08-15 DIAGNOSIS — I252 Old myocardial infarction: Secondary | ICD-10-CM | POA: Diagnosis not present

## 2016-08-15 DIAGNOSIS — Z7982 Long term (current) use of aspirin: Secondary | ICD-10-CM | POA: Diagnosis not present

## 2016-08-15 DIAGNOSIS — I10 Essential (primary) hypertension: Secondary | ICD-10-CM | POA: Diagnosis not present

## 2016-08-15 DIAGNOSIS — R000 Tachycardia, unspecified: Secondary | ICD-10-CM | POA: Diagnosis present

## 2016-08-15 LAB — CBC
HCT: 29.8 % — ABNORMAL LOW (ref 35.0–47.0)
HEMOGLOBIN: 10.1 g/dL — AB (ref 12.0–16.0)
MCH: 31.4 pg (ref 26.0–34.0)
MCHC: 33.7 g/dL (ref 32.0–36.0)
MCV: 93.1 fL (ref 80.0–100.0)
PLATELETS: 189 10*3/uL (ref 150–440)
RBC: 3.2 MIL/uL — AB (ref 3.80–5.20)
RDW: 15.7 % — ABNORMAL HIGH (ref 11.5–14.5)
WBC: 5.8 10*3/uL (ref 3.6–11.0)

## 2016-08-15 LAB — HEPATIC FUNCTION PANEL
ALK PHOS: 82 U/L (ref 38–126)
ALT: 20 U/L (ref 14–54)
AST: 23 U/L (ref 15–41)
Albumin: 3.2 g/dL — ABNORMAL LOW (ref 3.5–5.0)
Total Protein: 6.6 g/dL (ref 6.5–8.1)

## 2016-08-15 LAB — BASIC METABOLIC PANEL
ANION GAP: 5 (ref 5–15)
BUN: 21 mg/dL — ABNORMAL HIGH (ref 6–20)
CALCIUM: 8.6 mg/dL — AB (ref 8.9–10.3)
CO2: 25 mmol/L (ref 22–32)
CREATININE: 1.12 mg/dL — AB (ref 0.44–1.00)
Chloride: 108 mmol/L (ref 101–111)
GFR calc non Af Amer: 43 mL/min — ABNORMAL LOW (ref >60)
GFR, EST AFRICAN AMERICAN: 49 mL/min — AB (ref >60)
Glucose, Bld: 106 mg/dL — ABNORMAL HIGH (ref 65–99)
Potassium: 3.7 mmol/L (ref 3.5–5.1)
SODIUM: 138 mmol/L (ref 135–145)

## 2016-08-15 LAB — URINALYSIS COMPLETE WITH MICROSCOPIC (ARMC ONLY)
Bilirubin Urine: NEGATIVE
Glucose, UA: NEGATIVE mg/dL
Hgb urine dipstick: NEGATIVE
Ketones, ur: NEGATIVE mg/dL
Nitrite: POSITIVE — AB
PROTEIN: NEGATIVE mg/dL
SPECIFIC GRAVITY, URINE: 1.009 (ref 1.005–1.030)
Squamous Epithelial / LPF: NONE SEEN
pH: 5 (ref 5.0–8.0)

## 2016-08-15 LAB — TROPONIN I: TROPONIN I: 0.03 ng/mL — AB (ref <0.03)

## 2016-08-15 LAB — BRAIN NATRIURETIC PEPTIDE: B NATRIURETIC PEPTIDE 5: 357 pg/mL — AB (ref 0.0–100.0)

## 2016-08-15 LAB — LIPASE, BLOOD: Lipase: 94 U/L — ABNORMAL HIGH (ref 11–51)

## 2016-08-15 LAB — MAGNESIUM: Magnesium: 1.9 mg/dL (ref 1.7–2.4)

## 2016-08-15 MED ORDER — FOSFOMYCIN TROMETHAMINE 3 G PO PACK
3.0000 g | PACK | Freq: Once | ORAL | Status: DC
  Filled 2016-08-15: qty 3

## 2016-08-15 MED ORDER — CEPHALEXIN 500 MG PO CAPS
500.0000 mg | ORAL_CAPSULE | Freq: Once | ORAL | Status: AC
  Administered 2016-08-15: 500 mg via ORAL
  Filled 2016-08-15: qty 1

## 2016-08-15 MED ORDER — CEPHALEXIN 500 MG PO CAPS: 500.0000 mg | ORAL_CAPSULE | Freq: Two times a day (BID) | ORAL | 0 refills | Status: DC

## 2016-08-15 NOTE — Discharge Instructions (Signed)
Ms. Marie Gonzalez' workup in the ED was reassuring.  She has a very mild urinary tract infection (UTI) for which we gave a dose of Keflex 500 mg.  If her regular doctor prescribes Cipro for her, please do NOT start the Keflex prescription we provided.  If the doctor only starts her on Flagyl, please go ahead and treat her with the Keflex for five days as prescribed.  Otherwise her workup did not show any significant abnormalities . Her chest x-ray was clear and two troponins were normal.  Her lungs were clear and she has no evidence of CHF exacerbation.  We recommend that she follow-up with her regular doctor at the next available opportunity to discuss her symptoms today as well as her chronic medical conditions.   Return to the emergency department if you develop new or worsening symptoms that concern you.

## 2016-08-15 NOTE — ED Triage Notes (Signed)
[facility] with c/o elevated heart rate and low sats with activity.  Upon arrival to er pt placed on CM, EKG done.  Pt resting and denies chest pain at this time.

## 2016-08-15 NOTE — ED Notes (Signed)
Pt to us via stretcher

## 2016-08-15 NOTE — ED Provider Notes (Signed)
[facility] Emergency Department Provider Note  ____________________________________________   First MD Initiated Contact with Patient 08/15/16 1526     (approximate)  I have reviewed the triage vital signs and the nursing notes.   HISTORY  Chief Complaint Tachycardia  The patient's history is limited by a history of chronic dementia   HPI Marie Gonzalez is a 82 y.o. female  with an extensive past medical history and who lives at [facility] due to her memory issues who presents by EMS for evaluation of elevated heart rate.  Reportedly they have noticed over the last couple of days that when she is working with physical therapy or getting any exercise, including gentle ambulation, her oxygen saturation drops down to the upper 80s and she becomes tachycardic.  Today they noticed that at rest her heart rate was quite elevated although specific numbers are not available.  EMS reported that she was tachycardic for them as well, but by the time she came to the emergency department her heart rate was normal.  Of note, EMS felt like her breath sounds were diminished initially but improved after a nebulizer treatment.  She denies chest pain and shortness of breath although she agrees that sometimes she does get short of breath when she is walking around.  She also agreed that she feels like her heart is racing when she is walking but currently she feels fine.  She denies abdominal pain, nausea, and vomiting.  Her daughter reports that she has had a lot of diarrhea recently and that they are running a stool culture at her living facility but that they do not have results yet.  It Is unclear how severe her symptoms were initially and it sounds like the overall symptoms of been gradual in onset but the tachycardia occurs acutely.  Past Medical History:  Diagnosis Date  . A-fib (HCC)    Confirmed with pt's cardiology office - Holter with tachybrady -> went on to have  pacemaker  . Anemia    Prior h/o such, per [facility] cardiologist's office  . Anxiety   . Atrial fibrillation (HCC)   . Dementia   . Depression   . GERD (gastroesophageal reflux disease)   . Hypertension   . Myocardial infarction (HCC)   . Osteoporosis   . Sick sinus syndrome [facility])    Pacemaker ([employer]) - VVIR placed 2012 in [place], [place] & was put on Pradaxa    Patient Active Problem List   Diagnosis Date Noted  . Other hypertrophic cardiomyopathy (HCC) 06/27/2013  . Anemia 06/27/2013    Past Surgical History:  Procedure Laterality Date  . ABDOMINAL HYSTERECTOMY     partial  . APPENDECTOMY    . BREAST CYST EXCISION    . HERNIA REPAIR    . LEFT HEART CATHETERIZATION WITH CORONARY ANGIOGRAM N/A 06/26/2013   Procedure: LEFT HEART CATHETERIZATION WITH CORONARY ANGIOGRAM;  Surgeon: Kathleene Hazel, MD;  Location: [facility] CATH LAB;  Service: Cardiovascular;  Laterality: N/A;  . PACEMAKER INSERTION     2012  . RECTOCELE REPAIR     In 2000 - had complications from surgery - abdominal organs couldn't function, in critical condition & in hospital for 2 weeks  . TONSILLECTOMY      Prior to Admission medications   Medication Sig Start Date End Date Taking? Authorizing Provider  aspirin 325 MG tablet Take 325 mg by mouth daily.    Historical Provider, MD  cephALEXin (KEFLEX) 500 MG capsule Take 1 capsule (500  mg total) by mouth 2 (two) times daily. 08/15/16   Loleta Roseory Naithen Rivenburg, MD  gabapentin (NEURONTIN) 300 MG capsule Take 300-600 mg by mouth 2 (two) times daily. Take 600 mg in the morning,  300 mg in the afternoon and 600 mg at bedtime    Historical Provider, MD  lisinopril (PRINIVIL,ZESTRIL) 5 MG tablet Take 0.5 tablets (2.5 mg total) by mouth daily. 06/27/13   Brooke O Edmisten, PA-C  Melatonin 1 MG TABS Take 1 tablet by mouth daily as needed.    Historical Provider, MD  memantine (NAMENDA) 5 MG tablet Take 5 mg by mouth daily.    Historical Provider, MD  metoprolol succinate  (TOPROL-XL) 25 MG 24 hr tablet Take 0.5 tablets (12.5 mg total) by mouth at bedtime. 06/27/13   Brooke O Edmisten, PA-C  Multiple Vitamins-Minerals (MULTIVITAMIN WITH MINERALS) tablet Take 1 tablet by mouth daily.    Historical Provider, MD  omeprazole (PRILOSEC) 20 MG capsule Take 20 mg by mouth daily.    Historical Provider, MD  oxybutynin (DITROPAN-XL) 5 MG 24 hr tablet Take 5 mg by mouth at bedtime.    Historical Provider, MD  Skin Protectants, Misc. (EUCERIN) cream Apply 1 application topically as needed for dry skin.    Historical Provider, MD    Allergies Sulfa antibiotics  Family History  Problem Relation Age of Onset  . Heart attack Son     Died at 4250 of MI  . Heart attack Mother     Died at 8165 of MI    Social History Social History  Substance Use Topics  . Smoking status: Former Smoker    Packs/day: 1.00    Years: 25.00  . Smokeless tobacco: Never Used  . Alcohol use No    Review of Systems Level V Caveat:  Unable to obtain reliable review of systems due to chronic dementia  ____________________________________________   PHYSICAL EXAM:  VITAL SIGNS: ED Triage Vitals  Enc Vitals Group     BP 08/15/16 1448 (!) 134/100     Pulse Rate 08/15/16 1448 65     Resp 08/15/16 1448 18     Temp 08/15/16 1448 97.2 F (36.2 C)     Temp Source 08/15/16 1448 Oral     SpO2 08/15/16 1448 100 %     Weight 08/15/16 1448 160 lb (72.6 kg)     Height 08/15/16 1448 5\' 6"  (1.676 m)     Head Circumference --      Peak Flow --      Pain Score 08/15/16 1449 0     Pain Loc --      Pain Edu? --      Excl. in GC? --     Constitutional: Alert and oriented. Well appearing and in no acute distress. Eyes: Conjunctivae are normal. PERRL. EOMI. Head: Atraumatic. Nose: No congestion/rhinnorhea. Mouth/Throat: Mucous membranes are moist.  Oropharynx non-erythematous. Neck: No stridor.  No meningeal signs.   Cardiovascular: Normal rate, regular rhythm. Pacemaker.Good peripheral  circulation. Grossly normal heart sounds. Respiratory: Normal respiratory effort.  No retractions. Lungs CTAB. Gastrointestinal: Soft and nontender. No distention.  Musculoskeletal: No lower extremity tenderness nor edema. No gross deformities of extremities. Neurologic:  Normal speech and language. No gross focal neurologic deficits are appreciated.  Skin:  Skin is warm, dry and intact. No rash noted. Psychiatric: Mood and affect are normal. Speech and behavior are normal.  ____________________________________________   LABS (all labs ordered are listed, but only abnormal results are displayed)  Labs Reviewed  BASIC METABOLIC PANEL - Abnormal; Notable for the following:       Result Value   Glucose, Bld 106 (*)    BUN 21 (*)    Creatinine, Ser 1.12 (*)    Calcium 8.6 (*)    GFR calc non Af Amer 43 (*)    GFR calc Af Amer 49 (*)    All other components within normal limits  CBC - Abnormal; Notable for the following:    RBC 3.20 (*)    Hemoglobin 10.1 (*)    HCT 29.8 (*)    RDW 15.7 (*)    All other components within normal limits  TROPONIN I - Abnormal; Notable for the following:    Troponin I 0.03 (*)    All other components within normal limits  HEPATIC FUNCTION PANEL - Abnormal; Notable for the following:    Albumin 3.2 (*)    Total Bilirubin <0.1 (*)    Bilirubin, Direct <0.1 (*)    All other components within normal limits  LIPASE, BLOOD - Abnormal; Notable for the following:    Lipase 94 (*)    All other components within normal limits  URINALYSIS COMPLETEWITH MICROSCOPIC ([facility] ONLY) - Abnormal; Notable for the following:    Color, Urine YELLOW (*)    APPearance HAZY (*)    Nitrite POSITIVE (*)    Leukocytes, UA TRACE (*)    Bacteria, UA RARE (*)    All other components within normal limits  BRAIN NATRIURETIC PEPTIDE - Abnormal; Notable for the following:    B Natriuretic Peptide 357.0 (*)    All other components within normal limits  TROPONIN I - Abnormal;  Notable for the following:    Troponin I 0.03 (*)    All other components within normal limits  URINE CULTURE  MAGNESIUM   ____________________________________________  EKG  ED ECG REPORT I, Annetta Deiss, the attending physician, personally viewed and interpreted this ECG.   Date: 08/15/2016  EKG Time: 15:05  Rate: 111 as recorded by the computer, but the ventricular complexes are actually approximately 60 bpm and paced.  The computer is interpreting at least some of the T waves as additional QRS complexes.  Rhythm: Paced rhythm  Axis: Left axis deviation  Intervals:AV dual paced complexes  ST&T Change: Non-specific ST segment / T-wave changes, but no evidence of acute ischemia.   ____________________________________________  RADIOLOGY   Dg Chest 2 View  Result Date: 08/15/2016 CLINICAL DATA:  Tachycardia and decreased oxygen saturation EXAM: CHEST  2 VIEW COMPARISON:  06/25/2013 FINDINGS: Cardiac shadow is mildly enlarged. A pacing device is again seen and stable. Lungs are well-aerated with some mild interstitial changes stable from the prior exam and likely chronic in nature. A few small calcifications are noted consistent with prior granulomatous disease. No sizable infiltrate or effusion is seen. No bony abnormality is noted. IMPRESSION: No acute abnormality seen. Electronically Signed   By: Alcide Clever M.D.   On: 08/15/2016 16:00   US Venous Img Lower Unilateral Left  Result Date: 08/15/2016 CLINICAL DATA:  82 year old female with left lower extremity pain. EXAM: Left LOWER EXTREMITY VENOUS DOPPLER ULTRASOUND TECHNIQUE: Gray-scale sonography with graded compression, as well as color Doppler and duplex ultrasound were performed to evaluate the lower extremity deep venous systems from the level of the common femoral vein and including the common femoral, femoral, profunda femoral, popliteal and calf veins including the posterior tibial, peroneal and gastrocnemius veins when  visible. The superficial great saphenous vein was also  interrogated. Spectral Doppler was utilized to evaluate flow at rest and with distal augmentation maneuvers in the common femoral, femoral and popliteal veins. COMPARISON:  None. FINDINGS: Contralateral Common Femoral Vein: Respiratory phasicity is normal and symmetric with the symptomatic side. No evidence of thrombus. Normal compressibility. Common Femoral Vein: No evidence of thrombus. Normal compressibility, respiratory phasicity and response to augmentation. Saphenofemoral Junction: No evidence of thrombus. Normal compressibility and flow on color Doppler imaging. Profunda Femoral Vein: No evidence of thrombus. Normal compressibility and flow on color Doppler imaging. Femoral Vein: There is a duplicated appearance of the midportion of the femoral vein. No evidence of thrombus. Normal compressibility, respiratory phasicity and response to augmentation. Popliteal Vein: No evidence of thrombus. Normal compressibility, respiratory phasicity and response to augmentation. Calf Veins: No evidence of thrombus. Normal compressibility and flow on color Doppler imaging. Superficial Great Saphenous Vein: No evidence of thrombus. Normal compressibility and flow on color Doppler imaging. Venous Reflux:  None. Other Findings:  None. IMPRESSION: No evidence of deep venous thrombosis in the left lower extremity. Electronically Signed   By: Elgie Collard M.D.   On: 08/15/2016 18:22    ____________________________________________   PROCEDURES  Procedure(s) performed:   Procedures   Critical Care performed: No ____________________________________________   INITIAL IMPRESSION / ASSESSMENT AND PLAN / ED COURSE  Pertinent labs & imaging results that were available during my care of the patient were reviewed by me and considered in my medical decision making (see chart for details).  The patient is well-appearing in spite of her history and her current  symptoms.  I suspect that part of the reason that she is thought to have had several episodes of tachycardia is that the computer interpretation is picking up the T waves as additional QRS complexes.  She clearly has a paced rhythm of 60 bpm on her EKG but the EKG computer interpretation is a rate of 111.  I shared this with her daughter/Power of Attorney who is a Engineer, civil (consulting).  We are checking basic lab work and imaging and we will reassess   Clinical Course  Value Comment By Time   I had another discussion with the patient and her daughter.  Her daughter would like me to check a BNP given the patient's history of CHF.  I explained that I think that a CHF exacerbation is unlikely based on a clear chest x-ray and no signs of significant peripheral edema, but I will do so for reassurance.  Additionally, regarding the patient's chronic left leg pain, the family is concerned about the possibility of a DVT given her deconditioning and limited mobility.  We will evaluate with ultrasound while we are awaiting the repeat troponin.  Of additional note she does seem to have a mild urinary tract infection.  Her lipase of 94 is of unclear significance given no abdominal pain nor tenderness nor vomiting.  Loleta Rose, MD 09/06 1735  US Venous Img Lower Unilateral Left Reassuring U/S with no DVT. Loleta Rose, MD 09/06 1850   The patient remains comfortable with no acute distress, no chest pain, no abdominal pain, no other palpitations.  I try to treat her with fosfomycin for her urinary tract infection but the hospital is out of that medication.  I will give her a dose of Keflex and a five-day course.  However I will indicate on her discharge instructions that if her primary care doctor is, in fact, starting her on Cipro, then she likely should not start the Keflex.  She  will follow up closely as an outpatient.  The patient's daughter is comfortable with this plan. Loleta Rose, MD 09/06 1955     ____________________________________________  FINAL CLINICAL IMPRESSION(S) / ED DIAGNOSES  Final diagnoses:  Palpitations  Chronic leg pain, left  UTI (lower urinary tract infection)     MEDICATIONS GIVEN DURING THIS VISIT:  Medications  cephALEXin (KEFLEX) capsule 500 mg (500 mg Oral Given 08/15/16 1956)     NEW OUTPATIENT MEDICATIONS STARTED DURING THIS VISIT:  Discharge Medication List as of 08/15/2016  8:08 PM      Discharge Medication List as of 08/15/2016  8:08 PM    CONTINUE these medications which have CHANGED   Details  cephALEXin (KEFLEX) 500 MG capsule Take 1 capsule (500 mg total) by mouth 2 (two) times daily., Starting Wed 08/15/2016, Print        Discharge Medication List as of 08/15/2016  8:08 PM       Note:  This document was prepared using Dragon voice recognition software and may include unintentional dictation errors.    Loleta Rose, MD 08/15/16 2251

## 2016-08-16 LAB — TROPONIN I: TROPONIN I: 0.03 ng/mL — AB (ref <0.03)

## 2016-08-17 LAB — URINE CULTURE
Culture: >=100000 — AB
SPECIAL REQUESTS: NORMAL

## 2016-08-31 ENCOUNTER — Emergency Department: Payer: Medicare Other

## 2016-08-31 ENCOUNTER — Inpatient Hospital Stay
Admission: EM | Admit: 2016-08-31 | Discharge: 2016-09-09 | DRG: 534 | Disposition: E | Payer: Medicare Other | Attending: Internal Medicine | Admitting: Internal Medicine

## 2016-08-31 DIAGNOSIS — I4901 Ventricular fibrillation: Secondary | ICD-10-CM | POA: Diagnosis not present

## 2016-08-31 DIAGNOSIS — I071 Rheumatic tricuspid insufficiency: Secondary | ICD-10-CM | POA: Diagnosis present

## 2016-08-31 DIAGNOSIS — I2510 Atherosclerotic heart disease of native coronary artery without angina pectoris: Secondary | ICD-10-CM | POA: Diagnosis present

## 2016-08-31 DIAGNOSIS — S40021A Contusion of right upper arm, initial encounter: Secondary | ICD-10-CM | POA: Diagnosis present

## 2016-08-31 DIAGNOSIS — L8990 Pressure ulcer of unspecified site, unspecified stage: Secondary | ICD-10-CM | POA: Diagnosis present

## 2016-08-31 DIAGNOSIS — Z8249 Family history of ischemic heart disease and other diseases of the circulatory system: Secondary | ICD-10-CM

## 2016-08-31 DIAGNOSIS — S72492A Other fracture of lower end of left femur, initial encounter for closed fracture: Secondary | ICD-10-CM | POA: Diagnosis not present

## 2016-08-31 DIAGNOSIS — M25552 Pain in left hip: Secondary | ICD-10-CM | POA: Diagnosis not present

## 2016-08-31 DIAGNOSIS — F419 Anxiety disorder, unspecified: Secondary | ICD-10-CM | POA: Diagnosis present

## 2016-08-31 DIAGNOSIS — S7222XA Displaced subtrochanteric fracture of left femur, initial encounter for closed fracture: Secondary | ICD-10-CM

## 2016-08-31 DIAGNOSIS — K219 Gastro-esophageal reflux disease without esophagitis: Secondary | ICD-10-CM | POA: Diagnosis present

## 2016-08-31 DIAGNOSIS — Z882 Allergy status to sulfonamides status: Secondary | ICD-10-CM

## 2016-08-31 DIAGNOSIS — S7222XD Displaced subtrochanteric fracture of left femur, subsequent encounter for closed fracture with routine healing: Secondary | ICD-10-CM

## 2016-08-31 DIAGNOSIS — I252 Old myocardial infarction: Secondary | ICD-10-CM

## 2016-08-31 DIAGNOSIS — F329 Major depressive disorder, single episode, unspecified: Secondary | ICD-10-CM | POA: Diagnosis present

## 2016-08-31 DIAGNOSIS — I509 Heart failure, unspecified: Secondary | ICD-10-CM | POA: Diagnosis present

## 2016-08-31 DIAGNOSIS — I959 Hypotension, unspecified: Secondary | ICD-10-CM | POA: Diagnosis not present

## 2016-08-31 DIAGNOSIS — I110 Hypertensive heart disease with heart failure: Secondary | ICD-10-CM | POA: Diagnosis present

## 2016-08-31 DIAGNOSIS — Z87891 Personal history of nicotine dependence: Secondary | ICD-10-CM

## 2016-08-31 DIAGNOSIS — Z7982 Long term (current) use of aspirin: Secondary | ICD-10-CM

## 2016-08-31 DIAGNOSIS — I4891 Unspecified atrial fibrillation: Secondary | ICD-10-CM | POA: Diagnosis present

## 2016-08-31 DIAGNOSIS — S7290XA Unspecified fracture of unspecified femur, initial encounter for closed fracture: Secondary | ICD-10-CM | POA: Diagnosis present

## 2016-08-31 DIAGNOSIS — M810 Age-related osteoporosis without current pathological fracture: Secondary | ICD-10-CM | POA: Diagnosis present

## 2016-08-31 DIAGNOSIS — Z66 Do not resuscitate: Secondary | ICD-10-CM | POA: Diagnosis not present

## 2016-08-31 DIAGNOSIS — I272 Other secondary pulmonary hypertension: Secondary | ICD-10-CM | POA: Diagnosis present

## 2016-08-31 DIAGNOSIS — S0012XA Contusion of left eyelid and periocular area, initial encounter: Secondary | ICD-10-CM | POA: Diagnosis present

## 2016-08-31 DIAGNOSIS — I462 Cardiac arrest due to underlying cardiac condition: Secondary | ICD-10-CM | POA: Diagnosis not present

## 2016-08-31 DIAGNOSIS — F0390 Unspecified dementia without behavioral disturbance: Secondary | ICD-10-CM | POA: Diagnosis present

## 2016-08-31 DIAGNOSIS — Z950 Presence of cardiac pacemaker: Secondary | ICD-10-CM

## 2016-08-31 DIAGNOSIS — W06XXXA Fall from bed, initial encounter: Secondary | ICD-10-CM | POA: Diagnosis present

## 2016-08-31 DIAGNOSIS — D649 Anemia, unspecified: Secondary | ICD-10-CM | POA: Diagnosis present

## 2016-08-31 LAB — COMPREHENSIVE METABOLIC PANEL
ALT: 18 U/L (ref 14–54)
AST: 22 U/L (ref 15–41)
Albumin: 2.9 g/dL — ABNORMAL LOW (ref 3.5–5.0)
Alkaline Phosphatase: 67 U/L (ref 38–126)
Anion gap: 8 (ref 5–15)
BUN: 34 mg/dL — AB (ref 6–20)
CHLORIDE: 104 mmol/L (ref 101–111)
CO2: 25 mmol/L (ref 22–32)
CREATININE: 2.01 mg/dL — AB (ref 0.44–1.00)
Calcium: 8 mg/dL — ABNORMAL LOW (ref 8.9–10.3)
GFR calc Af Amer: 24 mL/min — ABNORMAL LOW (ref >60)
GFR calc non Af Amer: 21 mL/min — ABNORMAL LOW (ref >60)
Glucose, Bld: 134 mg/dL — ABNORMAL HIGH (ref 65–99)
POTASSIUM: 4.3 mmol/L (ref 3.5–5.1)
SODIUM: 137 mmol/L (ref 135–145)
Total Bilirubin: 0.7 mg/dL (ref 0.3–1.2)
Total Protein: 6.1 g/dL — ABNORMAL LOW (ref 6.5–8.1)

## 2016-08-31 LAB — TROPONIN I: Troponin I: 0.05 ng/mL (ref <0.03)

## 2016-08-31 LAB — CBC WITH DIFFERENTIAL/PLATELET
Basophils Absolute: 0 10*3/uL (ref 0–0.1)
Basophils Relative: 0 %
EOS PCT: 2 %
Eosinophils Absolute: 0.1 10*3/uL (ref 0–0.7)
HCT: 25 % — ABNORMAL LOW (ref 35.0–47.0)
Hemoglobin: 8.2 g/dL — ABNORMAL LOW (ref 12.0–16.0)
LYMPHS ABS: 0.4 10*3/uL — AB (ref 1.0–3.6)
LYMPHS PCT: 9 %
MCH: 30.9 pg (ref 26.0–34.0)
MCHC: 32.9 g/dL (ref 32.0–36.0)
MCV: 93.9 fL (ref 80.0–100.0)
MONO ABS: 0.5 10*3/uL (ref 0.2–0.9)
MONOS PCT: 11 %
Neutro Abs: 3.3 10*3/uL (ref 1.4–6.5)
Neutrophils Relative %: 78 %
PLATELETS: 182 10*3/uL (ref 150–440)
RBC: 2.66 MIL/uL — ABNORMAL LOW (ref 3.80–5.20)
RDW: 17.1 % — AB (ref 11.5–14.5)
WBC: 4.3 10*3/uL (ref 3.6–11.0)

## 2016-08-31 LAB — PROTIME-INR
INR: 1.25
Prothrombin Time: 15.8 seconds — ABNORMAL HIGH (ref 11.4–15.2)

## 2016-08-31 LAB — CK: Total CK: 343 U/L — ABNORMAL HIGH (ref 38–234)

## 2016-08-31 MED ORDER — MORPHINE SULFATE (PF) 2 MG/ML IV SOLN
2.0000 mg | Freq: Once | INTRAVENOUS | Status: AC
  Administered 2016-08-31: 2 mg via INTRAVENOUS
  Filled 2016-08-31: qty 1

## 2016-08-31 NOTE — ED Notes (Signed)
Patient transported to X-ray 

## 2016-08-31 NOTE — ED Notes (Signed)
Pt has visible bruising to right hand and elbow, left orbit. Pt's left leg is retracted and turned out. Pt c/o severe left leg pain.

## 2016-08-31 NOTE — ED Provider Notes (Signed)
[facility] Emergency Department Provider Note  ____________________________________________   First MD Initiated Contact with Patient 05-Sep-2016 2230     (approximate)  I have reviewed the triage vital signs and the nursing notes.   HISTORY  Chief Complaint Fall (left femur fracture/ head injury)  The patient has dementia at baseline and is not able to provide a reliable history or review of systems  HPI Marie Gonzalez is a 82 y.o. female with an extensive past medical history including chronic left leg pain who lives at [facility] due to her chronic dementia/memory issues.  She presents by EMS tonight for evaluation of acute severe left hip pain.  Reportedly she had an unwitnessed fall 2 days ago when she got up out of bed and was ambulating without any assistance.  She has extensive bruising to her right upper extremity and the left side of her face including a black eye with some swelling.  She was reportedly checked out with the physician on site and with some x-rays of her right hand and arm and shoulder but reportedly they did not get any imaging of her left lower extremity because she has chronic pain in that leg and she had been ambulating on it after the fall.  Her daughter is present currently and stated that when she saw her mother yesterday she was able to pull herself along with her feet and legs in her wheelchair without any apparent pain or difficulty.  But then they were informed that today the patient was in a lot of pain and could not get out of bed and she has not obvious deformity of the left lower extremity with external rotation and shortening of the extremity.  They reportedly obtain some x-rays of the hip and femur and the daughter was told that the patient has a distal left femur fracture.  She was brought to the emergency department for further evaluation and treatment.  The patient is in severe pain if any movement is attempted of the left  lower extremity and is in mild to moderate pain at baseline at rest.  She has some pain in her face but is denying headache and denying neck pain.   Past Medical History:  Diagnosis Date  . A-fib (HCC)    Confirmed with pt's cardiology office - Holter with tachybrady -> went on to have pacemaker  . Anemia    Prior h/o such, per [facility] cardiologist's office  . Anxiety   . Atrial fibrillation (HCC)   . Dementia   . Depression   . GERD (gastroesophageal reflux disease)   . Hypertension   . Myocardial infarction (HCC)   . Osteoporosis   . Sick sinus syndrome [facility])    Pacemaker ([employer]) - VVIR placed 2012 in [place], [place] & was put on Pradaxa    Patient Active Problem List   Diagnosis Date Noted  . Other hypertrophic cardiomyopathy (HCC) 06/27/2013  . Anemia 06/27/2013    Past Surgical History:  Procedure Laterality Date  . ABDOMINAL HYSTERECTOMY     partial  . APPENDECTOMY    . BREAST CYST EXCISION    . HERNIA REPAIR    . LEFT HEART CATHETERIZATION WITH CORONARY ANGIOGRAM N/A 06/26/2013   Procedure: LEFT HEART CATHETERIZATION WITH CORONARY ANGIOGRAM;  Surgeon: Kathleene Hazel, MD;  Location: [facility] CATH LAB;  Service: Cardiovascular;  Laterality: N/A;  . PACEMAKER INSERTION     2012  . RECTOCELE REPAIR     In 2000 - had complications  from surgery - abdominal organs couldn't function, in critical condition & in hospital for 2 weeks  . TONSILLECTOMY      Prior to Admission medications   Medication Sig Start Date End Date Taking? Authorizing Provider  aspirin 325 MG tablet Take 325 mg by mouth daily.    Historical Provider, MD  cephALEXin (KEFLEX) 500 MG capsule Take 1 capsule (500 mg total) by mouth 2 (two) times daily. 08/15/16   Loleta Rose, MD  gabapentin (NEURONTIN) 300 MG capsule Take 300-600 mg by mouth 2 (two) times daily. Take 600 mg in the morning,  300 mg in the afternoon and 600 mg at bedtime    Historical Provider, MD  lisinopril (PRINIVIL,ZESTRIL) 5 MG  tablet Take 0.5 tablets (2.5 mg total) by mouth daily. 06/27/13   Brooke O Edmisten, PA-C  Melatonin 1 MG TABS Take 1 tablet by mouth daily as needed.    Historical Provider, MD  memantine (NAMENDA) 5 MG tablet Take 5 mg by mouth daily.    Historical Provider, MD  metoprolol succinate (TOPROL-XL) 25 MG 24 hr tablet Take 0.5 tablets (12.5 mg total) by mouth at bedtime. 06/27/13   Brooke O Edmisten, PA-C  Multiple Vitamins-Minerals (MULTIVITAMIN WITH MINERALS) tablet Take 1 tablet by mouth daily.    Historical Provider, MD  omeprazole (PRILOSEC) 20 MG capsule Take 20 mg by mouth daily.    Historical Provider, MD  oxybutynin (DITROPAN-XL) 5 MG 24 hr tablet Take 5 mg by mouth at bedtime.    Historical Provider, MD  Skin Protectants, Misc. (EUCERIN) cream Apply 1 application topically as needed for dry skin.    Historical Provider, MD    Allergies Sulfa antibiotics  Family History  Problem Relation Age of Onset  . Heart attack Son     Died at 69 of MI  . Heart attack Mother     Died at 3 of MI    Social History Social History  Substance Use Topics  . Smoking status: Former Smoker    Packs/day: 1.00    Years: 25.00  . Smokeless tobacco: Never Used  . Alcohol use No    Review of Systems Constitutional: No fever/chills Eyes: No visual changes. ENT: No sore throat. Cardiovascular: Denies chest pain. Respiratory: Denies shortness of breath. Gastrointestinal: No abdominal pain.  No nausea, no vomiting.  No diarrhea.  No constipation. Genitourinary: Negative for dysuria. Musculoskeletal: Pain in left side of face and head.  Severe pain in LLE. Skin: Negative for rash. Neurological: Negative for headaches, focal weakness or numbness.  10-point ROS otherwise negative.  ____________________________________________   PHYSICAL EXAM:  VITAL SIGNS: ED Triage Vitals  Enc Vitals Group     BP --      Pulse --      Resp --      Temp --      Temp src --      SpO2 08/12/2016 2226 96 %       Weight 08/15/2016 2229 160 lb (72.6 kg)     Height 09/06/2016 2229 5\' 4"  (1.626 m)     Head Circumference --      Peak Flow --      Pain Score --      Pain Loc --      Pain Edu? --      Excl. in GC? --     Constitutional: Alert and oriented. Obvious trauma to head/face Eyes: Conjunctivae are normal. PERRL. EOMI. Head: Obvious trauma to the left side of her  head and face with a black eye and some swelling to the left temple region.   Nose: No congestion/rhinnorhea. Mouth/Throat: Mucous membranes are moist.  Oropharynx non-erythematous. Neck: No stridor.  No meningeal signs.  No cervical spine tenderness to palpation. Cardiovascular: Normal rate, regular rhythm. Good peripheral circulation. Grossly normal heart sounds. Respiratory: Normal respiratory effort.  No retractions. Lungs CTAB. Gastrointestinal: Soft and nontender. No distention.  Musculoskeletal: Subacute bruising to the right upper extremity including the hand but there is no tenderness to palpation of the hand with normal flexion and extension of the wrist, normal grip strength, and no snuffbox tenderness.  She has some scattered bruising throughout the right upper extremity but has full range of motion of her right shoulder.  He has obvious deformity of the left lower leg with external rotation and shortening and severe pain with any movement of the left leg.  She is able to wiggle her toes without difficulty and has normal pulses. Neurologic:  Normal speech and language. No gross focal neurologic deficits are appreciated.  Skin:  Skin is warm, dry and intact. No rash noted. Psychiatric: Mood and affect are normal. Speech and behavior are normal.  ____________________________________________   LABS (all labs ordered are listed, but only abnormal results are displayed)  Labs Reviewed  CBC WITH DIFFERENTIAL/PLATELET - Abnormal; Notable for the following:       Result Value   RBC 2.66 (*)    Hemoglobin 8.2 (*)    HCT 25.0  (*)    RDW 17.1 (*)    Lymphs Abs 0.4 (*)    All other components within normal limits  PROTIME-INR - Abnormal; Notable for the following:    Prothrombin Time 15.8 (*)    All other components within normal limits  URINE CULTURE  CK  TROPONIN I  COMPREHENSIVE METABOLIC PANEL  URINALYSIS COMPLETEWITH MICROSCOPIC ([facility] ONLY)  TYPE AND SCREEN   ____________________________________________  EKG  Pending ____________________________________________  RADIOLOGY   No results found.  ____________________________________________   PROCEDURES  Procedure(s) performed:   Procedures   Critical Care performed: No ____________________________________________   INITIAL IMPRESSION / ASSESSMENT AND PLAN / ED COURSE  Pertinent labs & imaging results that were available during my care of the patient were reviewed by me and considered in my medical decision making (see chart for details).  Obvious trauma to her face and head as well as her left lower extremity.  Her right upper extremity exam is reassuring but I certainly believe the report of femur fracture of the left side.  I will obtain a pelvis x-ray and femur x-ray on the left in addition to a preoperative chest x-ray.  I am also checking a head CT and C-spine CT.  Checking basic labs anticipating the need for surgical intervention.  Placing Foley catheter for comfort as well as preoperative care.  Discussed all of this with the patient's daughter  Clinical Course  Comment By Time  Transferring ED care to Dr. Baker JanusBrown Jesica Goheen, MD 09/22 2306    ____________________________________________  FINAL CLINICAL IMPRESSION(S) / ED DIAGNOSES  Final diagnoses:  None     MEDICATIONS GIVEN DURING THIS VISIT:  Medications  morphine 2 MG/ML injection 2 mg (2 mg Intravenous Given 07/01/16 2254)     NEW OUTPATIENT MEDICATIONS STARTED DURING THIS VISIT:  New Prescriptions   No medications on file    Modified Medications   No  medications on file    Discontinued Medications   No medications on file  Note:  This document was prepared using Dragon voice recognition software and may include unintentional dictation errors.    Loleta Rose, MD 08/30/2016 [phone]

## 2016-08-31 NOTE — ED Notes (Signed)
Pt's daughter reports pt was placed on steroids today due to the fact that pt's O2 decreases to 80s with exertion

## 2016-08-31 NOTE — ED Notes (Signed)
MD Forbach at bedside. 

## 2016-08-31 NOTE — ED Triage Notes (Signed)
Pt fell 2 days ago. Per daughter pt able to bear weight until today. Pt has visible bruising to left orbit. Pt's left leg is turned out and retracted up.

## 2016-09-01 ENCOUNTER — Inpatient Hospital Stay (HOSPITAL_COMMUNITY)
Admit: 2016-09-01 | Discharge: 2016-09-01 | Disposition: A | Discharge status: Home / Self Care | Payer: Medicare Other | Attending: Internal Medicine | Admitting: Internal Medicine

## 2016-09-01 DIAGNOSIS — I4891 Unspecified atrial fibrillation: Secondary | ICD-10-CM | POA: Diagnosis present

## 2016-09-01 DIAGNOSIS — F0390 Unspecified dementia without behavioral disturbance: Secondary | ICD-10-CM | POA: Diagnosis present

## 2016-09-01 DIAGNOSIS — W06XXXA Fall from bed, initial encounter: Secondary | ICD-10-CM | POA: Diagnosis present

## 2016-09-01 DIAGNOSIS — Z8249 Family history of ischemic heart disease and other diseases of the circulatory system: Secondary | ICD-10-CM | POA: Diagnosis not present

## 2016-09-01 DIAGNOSIS — Z87891 Personal history of nicotine dependence: Secondary | ICD-10-CM | POA: Diagnosis not present

## 2016-09-01 DIAGNOSIS — I252 Old myocardial infarction: Secondary | ICD-10-CM | POA: Diagnosis not present

## 2016-09-01 DIAGNOSIS — I959 Hypotension, unspecified: Secondary | ICD-10-CM | POA: Diagnosis not present

## 2016-09-01 DIAGNOSIS — M25552 Pain in left hip: Secondary | ICD-10-CM | POA: Diagnosis present

## 2016-09-01 DIAGNOSIS — F329 Major depressive disorder, single episode, unspecified: Secondary | ICD-10-CM | POA: Diagnosis present

## 2016-09-01 DIAGNOSIS — S72492A Other fracture of lower end of left femur, initial encounter for closed fracture: Secondary | ICD-10-CM | POA: Diagnosis present

## 2016-09-01 DIAGNOSIS — M810 Age-related osteoporosis without current pathological fracture: Secondary | ICD-10-CM | POA: Diagnosis present

## 2016-09-01 DIAGNOSIS — K219 Gastro-esophageal reflux disease without esophagitis: Secondary | ICD-10-CM | POA: Diagnosis present

## 2016-09-01 DIAGNOSIS — F419 Anxiety disorder, unspecified: Secondary | ICD-10-CM | POA: Diagnosis present

## 2016-09-01 DIAGNOSIS — I462 Cardiac arrest due to underlying cardiac condition: Secondary | ICD-10-CM | POA: Diagnosis not present

## 2016-09-01 DIAGNOSIS — Z7982 Long term (current) use of aspirin: Secondary | ICD-10-CM | POA: Diagnosis not present

## 2016-09-01 DIAGNOSIS — S40021A Contusion of right upper arm, initial encounter: Secondary | ICD-10-CM | POA: Diagnosis present

## 2016-09-01 DIAGNOSIS — Z950 Presence of cardiac pacemaker: Secondary | ICD-10-CM | POA: Diagnosis not present

## 2016-09-01 DIAGNOSIS — S0012XA Contusion of left eyelid and periocular area, initial encounter: Secondary | ICD-10-CM | POA: Diagnosis present

## 2016-09-01 DIAGNOSIS — S7290XA Unspecified fracture of unspecified femur, initial encounter for closed fracture: Secondary | ICD-10-CM | POA: Diagnosis present

## 2016-09-01 DIAGNOSIS — L8990 Pressure ulcer of unspecified site, unspecified stage: Secondary | ICD-10-CM | POA: Diagnosis present

## 2016-09-01 DIAGNOSIS — I071 Rheumatic tricuspid insufficiency: Secondary | ICD-10-CM | POA: Diagnosis present

## 2016-09-01 DIAGNOSIS — I110 Hypertensive heart disease with heart failure: Secondary | ICD-10-CM | POA: Diagnosis present

## 2016-09-01 DIAGNOSIS — I2510 Atherosclerotic heart disease of native coronary artery without angina pectoris: Secondary | ICD-10-CM | POA: Diagnosis present

## 2016-09-01 DIAGNOSIS — I509 Heart failure, unspecified: Secondary | ICD-10-CM | POA: Diagnosis present

## 2016-09-01 DIAGNOSIS — D649 Anemia, unspecified: Secondary | ICD-10-CM | POA: Diagnosis present

## 2016-09-01 DIAGNOSIS — Z66 Do not resuscitate: Secondary | ICD-10-CM | POA: Diagnosis not present

## 2016-09-01 DIAGNOSIS — I4901 Ventricular fibrillation: Secondary | ICD-10-CM | POA: Diagnosis not present

## 2016-09-01 DIAGNOSIS — I272 Other secondary pulmonary hypertension: Secondary | ICD-10-CM | POA: Diagnosis present

## 2016-09-01 LAB — TYPE AND SCREEN
ABO/RH(D): A NEG
Antibody Screen: NEGATIVE

## 2016-09-01 LAB — URINALYSIS COMPLETE WITH MICROSCOPIC (ARMC ONLY)
BACTERIA UA: NONE SEEN
Bilirubin Urine: NEGATIVE
Glucose, UA: NEGATIVE mg/dL
HGB URINE DIPSTICK: NEGATIVE
KETONES UR: NEGATIVE mg/dL
Leukocytes, UA: NEGATIVE
NITRITE: NEGATIVE
PH: 5 (ref 5.0–8.0)
PROTEIN: 100 mg/dL — AB
Specific Gravity, Urine: 1.021 (ref 1.005–1.030)

## 2016-09-01 LAB — SURGICAL PCR SCREEN
MRSA, PCR: NEGATIVE
Staphylococcus aureus: NEGATIVE

## 2016-09-01 LAB — ECHOCARDIOGRAM COMPLETE
Height: 64 in
Weight: 2608 oz

## 2016-09-01 LAB — TSH: TSH: 2.874 u[IU]/mL (ref 0.350–4.500)

## 2016-09-01 MED ORDER — DOCUSATE SODIUM 100 MG PO CAPS
100.0000 mg | ORAL_CAPSULE | Freq: Two times a day (BID) | ORAL | Status: DC
  Administered 2016-09-01 (×2): 100 mg via ORAL
  Filled 2016-09-01 (×3): qty 1

## 2016-09-01 MED ORDER — HEPARIN SODIUM (PORCINE) 5000 UNIT/ML IJ SOLN
5000.0000 [IU] | Freq: Three times a day (TID) | INTRAMUSCULAR | Status: DC
  Administered 2016-09-01: 5000 [IU] via SUBCUTANEOUS
  Filled 2016-09-01 (×2): qty 1

## 2016-09-01 MED ORDER — ADULT MULTIVITAMIN W/MINERALS CH
1.0000 | ORAL_TABLET | Freq: Every day | ORAL | Status: DC
  Filled 2016-09-01: qty 1

## 2016-09-01 MED ORDER — ACETAMINOPHEN 325 MG PO TABS
650.0000 mg | ORAL_TABLET | Freq: Four times a day (QID) | ORAL | Status: DC | PRN
  Administered 2016-09-01: 650 mg via ORAL
  Filled 2016-09-01: qty 2

## 2016-09-01 MED ORDER — FUROSEMIDE 20 MG PO TABS: 20.0000 mg | ORAL_TABLET | Freq: Every day | ORAL | Status: DC | PRN

## 2016-09-01 MED ORDER — SODIUM CHLORIDE 0.9 % IV SOLN
INTRAVENOUS | Status: DC
  Administered 2016-09-01 – 2016-09-02 (×4): via INTRAVENOUS

## 2016-09-01 MED ORDER — POTASSIUM CHLORIDE CRYS ER 20 MEQ PO TBCR
20.0000 meq | EXTENDED_RELEASE_TABLET | Freq: Every day | ORAL | Status: DC
  Administered 2016-09-01: 20 meq via ORAL
  Filled 2016-09-01: qty 1

## 2016-09-01 MED ORDER — CEFAZOLIN IN D5W 1 GM/50ML IV SOLN
1.0000 g | Freq: Once | INTRAVENOUS | Status: DC
  Filled 2016-09-01: qty 50

## 2016-09-01 MED ORDER — MELATONIN 3 MG PO TABS
3.0000 mg | ORAL_TABLET | Freq: Every day | ORAL | Status: DC
Start: 2016-09-01 — End: 2016-09-01

## 2016-09-01 MED ORDER — DONEPEZIL HCL 5 MG PO TABS
5.0000 mg | ORAL_TABLET | Freq: Two times a day (BID) | ORAL | Status: DC
  Filled 2016-09-01 (×2): qty 1

## 2016-09-01 MED ORDER — MEMANTINE HCL-DONEPEZIL HCL ER 28-10 MG PO CP24: 1.0000 | ORAL_CAPSULE | Freq: Every day | ORAL | Status: DC

## 2016-09-01 MED ORDER — METOPROLOL TARTRATE 25 MG PO TABS
12.5000 mg | ORAL_TABLET | Freq: Two times a day (BID) | ORAL | Status: DC
  Administered 2016-09-01: 12.5 mg via ORAL
  Filled 2016-09-01: qty 1
  Filled 2016-09-01 (×3): qty 0.5
  Filled 2016-09-01 (×3): qty 1

## 2016-09-01 MED ORDER — MEMANTINE HCL ER 14 MG PO CP24
28.0000 mg | ORAL_CAPSULE | Freq: Every day | ORAL | Status: DC
  Filled 2016-09-01: qty 2

## 2016-09-01 MED ORDER — POTASSIUM CHLORIDE CRYS ER 20 MEQ PO TBCR: 20.0000 meq | EXTENDED_RELEASE_TABLET | ORAL | Status: DC

## 2016-09-01 MED ORDER — HYDROCODONE-ACETAMINOPHEN 5-325 MG PO TABS
1.0000 | ORAL_TABLET | ORAL | Status: DC | PRN
  Administered 2016-09-01 (×3): 1 via ORAL
  Filled 2016-09-01 (×3): qty 1

## 2016-09-01 MED ORDER — MELATONIN 5 MG PO TABS
2.5000 mg | ORAL_TABLET | Freq: Every day | ORAL | Status: DC
  Filled 2016-09-01: qty 0.5

## 2016-09-01 MED ORDER — OMEGA-3-ACID ETHYL ESTERS 1 G PO CAPS
1.0000 g | ORAL_CAPSULE | Freq: Every day | ORAL | Status: DC
  Filled 2016-09-01: qty 1

## 2016-09-01 MED ORDER — INFLUENZA VAC SPLIT QUAD 0.5 ML IM SUSY: 0.5000 mL | PREFILLED_SYRINGE | INTRAMUSCULAR | Status: DC

## 2016-09-01 MED ORDER — SERTRALINE HCL 100 MG PO TABS
100.0000 mg | ORAL_TABLET | Freq: Every day | ORAL | Status: DC
  Administered 2016-09-01: 100 mg via ORAL
  Filled 2016-09-01: qty 1

## 2016-09-01 MED ORDER — ONDANSETRON HCL 4 MG/2ML IJ SOLN: 4.0000 mg | Freq: Four times a day (QID) | INTRAMUSCULAR | Status: DC | PRN

## 2016-09-01 MED ORDER — ALPRAZOLAM 0.25 MG PO TABS
0.2500 mg | ORAL_TABLET | Freq: Every day | ORAL | Status: DC
  Administered 2016-09-01: 0.25 mg via ORAL
  Filled 2016-09-01: qty 1

## 2016-09-01 MED ORDER — PANTOPRAZOLE SODIUM 40 MG PO TBEC
40.0000 mg | DELAYED_RELEASE_TABLET | Freq: Every day | ORAL | Status: DC
  Administered 2016-09-01: 40 mg via ORAL
  Filled 2016-09-01: qty 1

## 2016-09-01 MED ORDER — OXYBUTYNIN CHLORIDE 5 MG PO TABS
5.0000 mg | ORAL_TABLET | Freq: Every day | ORAL | Status: DC
  Administered 2016-09-01: 5 mg via ORAL
  Filled 2016-09-01: qty 1

## 2016-09-01 MED ORDER — SODIUM CHLORIDE 0.9 % IV BOLUS (SEPSIS)
1000.0000 mL | Freq: Once | INTRAVENOUS | Status: AC
  Administered 2016-08-31: 1000 mL via INTRAVENOUS

## 2016-09-01 MED ORDER — ACETAMINOPHEN 650 MG RE SUPP: 650.0000 mg | Freq: Four times a day (QID) | RECTAL | Status: DC | PRN

## 2016-09-01 MED ORDER — ONDANSETRON HCL 4 MG PO TABS: 4.0000 mg | ORAL_TABLET | Freq: Four times a day (QID) | ORAL | Status: DC | PRN

## 2016-09-01 MED ORDER — GABAPENTIN 300 MG PO CAPS
300.0000 mg | ORAL_CAPSULE | Freq: Three times a day (TID) | ORAL | Status: DC
  Administered 2016-09-01 (×3): 300 mg via ORAL
  Filled 2016-09-01 (×3): qty 1

## 2016-09-01 NOTE — ED Notes (Signed)
[facility]

## 2016-09-01 NOTE — Progress Notes (Signed)
Pt with blood pressure of 113/44 pulse of 61, has metoprolol ordered this morning. Pt with hip fracture possible surgery in future and heparin ordered. Per Dr. Sheryle Hailiamond hold metoprolol and heparin at this time.

## 2016-09-01 NOTE — ED Notes (Signed)
Pt's BP dropped after morphine administration. MD Manson PasseyBrown notified. MD Manson PasseyBrown came to bedside. Pt placed in trendelenberg. MD ordered 1000 mL NaCl bolus on pressure bag. Bolus administered. Will continue to monitor

## 2016-09-01 NOTE — Consult Note (Signed)
Patient is an 82 year old who has been ambulatory but has been having steadily decreased activity since her husband passed away several months ago. [facility]. She is uncertain of when she injured her leg there was a fall about 3 weeks ago. She has been moving around and sitting in a wheelchair moving propelling herself forward using her feet but starting Thursday she had increased pain. She had a fall with x-rays of the right side but the left leg is not bothering her then yesterday she is complaining great deal of left leg pain and the leg had some deformity with external rotation. She is brought to the emergency room she is found to have a comminuted distal femur supracondylar fracture without prosthesis in place.  On exam she has palpable dorsalis pedis and posterior tibial pulse skin is intact around the left thigh with the leg shortened and externally rotated with moderate swelling. Skin is intact.  X-rays show a comminuted distal shaft fracture possible intra-articular extension.  Recommendation is for ORIF with a subcutaneous plating technique and this should allow her to be mobilized and then the knee as well as should be L bear weight to do transfers. Risks benefits, possible complications were discussed with the patient's daughter. We'll plan on surgery tomorrow

## 2016-09-01 NOTE — H&P (Signed)
Marie Gonzalez is an 82 y.o. female.  Chief Complaint: Fall HPI: The patient with past medical history of dementia and atrial fibrillation presents emergency department complaining of left leg pain. The patient had been evaluated for the same approximately 2 weeks ago. She's had several falls since that time. Evaluation at her skilled nursing facility did not reveal any acute abnormality however her daughter who is a nurse was concerned that her mother was not is mobile or acting as she usually does. Initial exam in the emergency department was significant for a swollen left knee. X-ray revealed fracture of the lower femur into the joint space. Thus emergency department staff called the hospitalist service for admission.  Past Medical History:  Diagnosis Date  . A-fib ([place])    Confirmed with pt's cardiology office - Holter with tachybrady -> went on to have pacemaker  . Anemia    Prior h/o such, per [facility] cardiologist's office  . Anxiety   . Atrial fibrillation ([place])   . Dementia   . Depression   . GERD (gastroesophageal reflux disease)   . Hypertension   . Myocardial infarction ([place])   . Osteoporosis   . Sick sinus syndrome [facility])    Pacemaker ([employer]) - VVIR placed 2012 in [place], [place] & was put on Pradaxa    Past Surgical History:  Procedure Laterality Date  . ABDOMINAL HYSTERECTOMY     partial  . APPENDECTOMY    . BREAST CYST EXCISION    . HERNIA REPAIR    . LEFT HEART CATHETERIZATION WITH CORONARY ANGIOGRAM N/A 06/26/2013   Procedure: LEFT HEART CATHETERIZATION WITH CORONARY ANGIOGRAM;  Surgeon: Burnell Blanks, MD;  Location: [facility] CATH LAB;  Service: Cardiovascular;  Laterality: N/A;  . PACEMAKER INSERTION     2012  . RECTOCELE REPAIR     In 2641 - had complications from surgery - abdominal organs couldn't function, in critical condition & in hospital for 2 weeks  . TONSILLECTOMY      Family History  Problem Relation Age of Onset  . Heart attack Son     Died at  25 of MI  . Heart attack Mother     Died at 62 of MI   Social History:  reports that she has quit smoking. She has a 25.00 pack-year smoking history. She has never used smokeless tobacco. She reports that she does not drink alcohol or use drugs.  Allergies:  Allergies  Allergen Reactions  . Sulfa Antibiotics Rash     (Not in a hospital admission)  Results for orders placed or performed during the hospital encounter of 08/12/2016 (from the past 48 hour(s))  CBC with Differential/Platelet     Status: Abnormal   Collection Time: 09/06/2016 10:53 PM  Result Value Ref Range   WBC 4.3 3.6 - 11.0 K/uL   RBC 2.66 (L) 3.80 - 5.20 MIL/uL   Hemoglobin 8.2 (L) 12.0 - 16.0 g/dL   HCT 25.0 (L) 35.0 - 47.0 %   MCV 93.9 80.0 - 100.0 fL   MCH 30.9 26.0 - 34.0 pg   MCHC 32.9 32.0 - 36.0 g/dL   RDW 17.1 (H) 11.5 - 14.5 %   Platelets 182 150 - 440 K/uL   Neutrophils Relative % 78 %   Neutro Abs 3.3 1.4 - 6.5 K/uL   Lymphocytes Relative 9 %   Lymphs Abs 0.4 (L) 1.0 - 3.6 K/uL   Monocytes Relative 11 %   Monocytes Absolute 0.5 0.2 - 0.9 K/uL   Eosinophils  Relative 2 %   Eosinophils Absolute 0.1 0 - 0.7 K/uL   Basophils Relative 0 %   Basophils Absolute 0.0 0 - 0.1 K/uL  Type and screen [facility]     Status: None (Preliminary result)   Collection Time: 09/04/2016 10:53 PM  Result Value Ref Range   ABO/RH(D) A NEG    Antibody Screen PENDING    Sample Expiration 09/03/2016   CK     Status: Abnormal   Collection Time: 08/18/2016 10:53 PM  Result Value Ref Range   Total CK 343 (H) 38 - 234 U/L  Troponin I     Status: Abnormal   Collection Time: 08/20/2016 10:53 PM  Result Value Ref Range   Troponin I 0.05 (HH) <0.03 ng/mL    Comment: CRITICAL RESULT CALLED TO, READ BACK BY AND VERIFIED WITH JENNA ELLINGTON AT 2332 08/25/2016.PMH  Protime-INR     Status: Abnormal   Collection Time: 08/17/2016 10:53 PM  Result Value Ref Range   Prothrombin Time 15.8 (H) 11.4 - 15.2 seconds   INR  1.25   Comprehensive metabolic panel     Status: Abnormal   Collection Time: 08/20/2016 10:53 PM  Result Value Ref Range   Sodium 137 135 - 145 mmol/L   Potassium 4.3 3.5 - 5.1 mmol/L   Chloride 104 101 - 111 mmol/L   CO2 25 22 - 32 mmol/L   Glucose, Bld 134 (H) 65 - 99 mg/dL   BUN 34 (H) 6 - 20 mg/dL   Creatinine, Ser 2.01 (H) 0.44 - 1.00 mg/dL   Calcium 8.0 (L) 8.9 - 10.3 mg/dL   Total Protein 6.1 (L) 6.5 - 8.1 g/dL   Albumin 2.9 (L) 3.5 - 5.0 g/dL   AST 22 15 - 41 U/L   ALT 18 14 - 54 U/L   Alkaline Phosphatase 67 38 - 126 U/L   Total Bilirubin 0.7 0.3 - 1.2 mg/dL   GFR calc non Af Amer 21 (L) >60 mL/min   GFR calc Af Amer 24 (L) >60 mL/min    Comment: (NOTE) The eGFR has been calculated using the CKD EPI equation. This calculation has not been validated in all clinical situations. eGFR's persistently <60 mL/min signify possible Chronic Kidney Disease.    Anion gap 8 5 - 15  Urinalysis complete, with microscopic ([facility] only)     Status: Abnormal   Collection Time: 08/15/2016 10:54 PM  Result Value Ref Range   Color, Urine AMBER (A) YELLOW   APPearance HAZY (A) CLEAR   Glucose, UA NEGATIVE NEGATIVE mg/dL   Bilirubin Urine NEGATIVE NEGATIVE   Ketones, ur NEGATIVE NEGATIVE mg/dL   Specific Gravity, Urine 1.021 1.005 - 1.030   Hgb urine dipstick NEGATIVE NEGATIVE   pH 5.0 5.0 - 8.0   Protein, ur 100 (A) NEGATIVE mg/dL   Nitrite NEGATIVE NEGATIVE   Leukocytes, UA NEGATIVE NEGATIVE   RBC / HPF 0-5 0 - 5 RBC/hpf   WBC, UA 0-5 0 - 5 WBC/hpf   Bacteria, UA NONE SEEN NONE SEEN   Squamous Epithelial / LPF 0-5 (A) NONE SEEN   Hyaline Casts, UA PRESENT    Granular Casts, UA PRESENT    Dg Pelvis 1-2 Views  Result Date: 08/11/2016 CLINICAL DATA:  Status post fall 3 weeks ago, and unable to bear weight. Initial encounter. EXAM: PELVIS - 1-2 VIEW COMPARISON:  None. FINDINGS: There is no evidence of fracture or dislocation. Both femoral heads are seated normally within their respective  acetabula. No significant degenerative change is appreciated. The sacroiliac joints are unremarkable in appearance. The visualized bowel gas pattern is grossly unremarkable in appearance. IMPRESSION: No evidence of fracture or dislocation. Electronically Signed   By: Garald Balding M.D.   On: 09/06/2016 23:48   Ct Head Wo Contrast  Result Date: 08/13/2016 CLINICAL DATA:  [place] 2 days ago, LEFT orbital bruising. Unable to bear weight. History of hypertension, atrial fibrillation. EXAM: CT HEAD WITHOUT CONTRAST CT MAXILLOFACIAL WITHOUT CONTRAST CT CERVICAL SPINE WITHOUT CONTRAST TECHNIQUE: Multidetector CT imaging of the head, cervical spine, and maxillofacial structures were performed using the standard protocol without intravenous contrast. Multiplanar CT image reconstructions of the cervical spine and maxillofacial structures were also generated. COMPARISON:  None. FINDINGS: CT HEAD FINDINGS BRAIN: The ventricles and sulci are normal for age. No intraparenchymal hemorrhage, mass effect nor midline shift. Patchy supratentorial white matter hypodensities within normal range for patient's age, though non-specific are most compatible with chronic small vessel ischemic disease. No acute large vascular territory infarcts. No abnormal extra-axial fluid collections. Basal cisterns are patent. VASCULAR: Moderate calcific atherosclerosis of the carotid siphons. SKULL: No skull fracture. No significant scalp soft tissue swelling. OTHER: None. CT MAXILLOFACIAL FINDINGS OSSEOUS: The mandible is intact, the condyles are located. No acute facial fracture. No destructive bony lesions. ORBITS: Ocular globes intact, status post RIGHT ocular lens implant. Normal appearance of the orbital contents. SINUSES: Paranasal sinuses are well aerated. Nasal septum is mildly deviated to the RIGHT. Included mastoid aircells are well aerated. Debris within the bilateral external auditory canals. SOFT TISSUES: Small LEFT supraorbital/frontal  scalp hematoma without subcutaneous gas or radiopaque foreign bodies. CT CERVICAL SPINE FINDINGS- moderately motion degraded examination. ALIGNMENT: Minimal grade 1 C4-5 and C7-T1 anterolisthesis without spondylolysis. Maintenance of cervical lordosis. SKULL BASE AND VERTEBRAE: Cervical vertebral bodies and posterior elements are intact. Severe RIGHT upper cervical facet arthropathy. Intervertebral disc heights preserved with multilevel mild uncovertebral hypertrophy and endplate spurring. C1-2 articulation maintained. No destructive bony lesions. Bilateral C7 ribs. SOFT TISSUES AND SPINAL CANAL: Included prevertebral and paraspinal soft tissues are nonacute. Moderate calcific atherosclerosis of the carotid bulbs. DISC LEVELS: No significant osseous canal stenosis. Moderate RIGHT C3-4 neural foraminal narrowing. UPPER CHEST: Minimal RIGHT apical nodular scarring. OTHER: None. IMPRESSION: CT HEAD: Small LEFT frontal/ supraorbital scalp hematoma. No skull fracture. No acute intracranial process; negative CT HEAD for age. CT MAXILLOFACIAL: No acute fracture or postseptal hematoma. CT CERVICAL SPINE: No acute fracture. Minimal grade 1 C4-5 and C7-T1 anterolisthesis on degenerative basis. Electronically Signed   By: Elon Alas M.D.   On: 08/29/2016 23:42   Ct Cervical Spine Wo Contrast  Result Date: 08/11/2016 CLINICAL DATA:  [place] 2 days ago, LEFT orbital bruising. Unable to bear weight. History of hypertension, atrial fibrillation. EXAM: CT HEAD WITHOUT CONTRAST CT MAXILLOFACIAL WITHOUT CONTRAST CT CERVICAL SPINE WITHOUT CONTRAST TECHNIQUE: Multidetector CT imaging of the head, cervical spine, and maxillofacial structures were performed using the standard protocol without intravenous contrast. Multiplanar CT image reconstructions of the cervical spine and maxillofacial structures were also generated. COMPARISON:  None. FINDINGS: CT HEAD FINDINGS BRAIN: The ventricles and sulci are normal for age. No  intraparenchymal hemorrhage, mass effect nor midline shift. Patchy supratentorial white matter hypodensities within normal range for patient's age, though non-specific are most compatible with chronic small vessel ischemic disease. No acute large vascular territory infarcts. No abnormal extra-axial fluid collections. Basal cisterns are patent. VASCULAR: Moderate calcific atherosclerosis of the carotid siphons. SKULL: No skull fracture. No significant  scalp soft tissue swelling. OTHER: None. CT MAXILLOFACIAL FINDINGS OSSEOUS: The mandible is intact, the condyles are located. No acute facial fracture. No destructive bony lesions. ORBITS: Ocular globes intact, status post RIGHT ocular lens implant. Normal appearance of the orbital contents. SINUSES: Paranasal sinuses are well aerated. Nasal septum is mildly deviated to the RIGHT. Included mastoid aircells are well aerated. Debris within the bilateral external auditory canals. SOFT TISSUES: Small LEFT supraorbital/frontal scalp hematoma without subcutaneous gas or radiopaque foreign bodies. CT CERVICAL SPINE FINDINGS- moderately motion degraded examination. ALIGNMENT: Minimal grade 1 C4-5 and C7-T1 anterolisthesis without spondylolysis. Maintenance of cervical lordosis. SKULL BASE AND VERTEBRAE: Cervical vertebral bodies and posterior elements are intact. Severe RIGHT upper cervical facet arthropathy. Intervertebral disc heights preserved with multilevel mild uncovertebral hypertrophy and endplate spurring. C1-2 articulation maintained. No destructive bony lesions. Bilateral C7 ribs. SOFT TISSUES AND SPINAL CANAL: Included prevertebral and paraspinal soft tissues are nonacute. Moderate calcific atherosclerosis of the carotid bulbs. DISC LEVELS: No significant osseous canal stenosis. Moderate RIGHT C3-4 neural foraminal narrowing. UPPER CHEST: Minimal RIGHT apical nodular scarring. OTHER: None. IMPRESSION: CT HEAD: Small LEFT frontal/ supraorbital scalp hematoma. No  skull fracture. No acute intracranial process; negative CT HEAD for age. CT MAXILLOFACIAL: No acute fracture or postseptal hematoma. CT CERVICAL SPINE: No acute fracture. Minimal grade 1 C4-5 and C7-T1 anterolisthesis on degenerative basis. Electronically Signed   By: Elon Alas M.D.   On: 08/12/2016 23:42   Dg Chest Port 1 View  Result Date: 08/24/2016 CLINICAL DATA:  Preoperative chest radiograph for left femoral fracture. Initial encounter. EXAM: PORTABLE CHEST 1 VIEW COMPARISON:  Chest radiograph from 08/15/2016 FINDINGS: There is elevation of the right hemidiaphragm. Mild peribronchial thickening is noted. Chronically increased interstitial markings are seen. No pleural effusion or pneumothorax is identified. The cardiomediastinal silhouette is mildly enlarged. No acute osseous abnormalities are identified. A pacemaker lead is noted overlying the right ventricle. IMPRESSION: Elevation of the right hemidiaphragm. Mild peribronchial thickening noted. Chronically increased interstitial markings seen. Mild cardiomegaly. Electronically Signed   By: Garald Balding M.D.   On: 08/12/2016 23:47   Dg Femur Min 2 Views Left  Result Date: 08/17/2016 CLINICAL DATA:  Status post fall, with left femoral pain. Initial encounter. EXAM: LEFT FEMUR 2 VIEWS COMPARISON:  None. FINDINGS: There is a mildly comminuted fracture of the distal femoral metadiaphysis, with shortening and mild displacement of multiple fragments. The largest distal fragment demonstrates approximately 3 cm of shortening and displacement. Angulation of fragments varies depending on positioning. The fracture likely extends to the intercondylar notch, though this is not definitively characterized. No additional fractures are seen. The patella is grossly unremarkable in appearance. The left femoral head remains seated at the acetabulum. Soft tissue swelling is noted about the fracture site. IMPRESSION: Mildly comminuted fracture of the distal  femoral metadiaphysis, with shortening and mild displacement of multiple fragments. Largest distal fragment demonstrates 3 cm of shortening and displacement. Angulation of fragments varies depending on positioning. The fracture likely extends to the intercondylar notch, though this is not definitively characterized. Electronically Signed   By: Garald Balding M.D.   On: 08/26/2016 23:52   Ct Maxillofacial Wo Contrast  Result Date: 08/12/2016 CLINICAL DATA:  [place] 2 days ago, LEFT orbital bruising. Unable to bear weight. History of hypertension, atrial fibrillation. EXAM: CT HEAD WITHOUT CONTRAST CT MAXILLOFACIAL WITHOUT CONTRAST CT CERVICAL SPINE WITHOUT CONTRAST TECHNIQUE: Multidetector CT imaging of the head, cervical spine, and maxillofacial structures were performed using the standard protocol without intravenous  contrast. Multiplanar CT image reconstructions of the cervical spine and maxillofacial structures were also generated. COMPARISON:  None. FINDINGS: CT HEAD FINDINGS BRAIN: The ventricles and sulci are normal for age. No intraparenchymal hemorrhage, mass effect nor midline shift. Patchy supratentorial white matter hypodensities within normal range for patient's age, though non-specific are most compatible with chronic small vessel ischemic disease. No acute large vascular territory infarcts. No abnormal extra-axial fluid collections. Basal cisterns are patent. VASCULAR: Moderate calcific atherosclerosis of the carotid siphons. SKULL: No skull fracture. No significant scalp soft tissue swelling. OTHER: None. CT MAXILLOFACIAL FINDINGS OSSEOUS: The mandible is intact, the condyles are located. No acute facial fracture. No destructive bony lesions. ORBITS: Ocular globes intact, status post RIGHT ocular lens implant. Normal appearance of the orbital contents. SINUSES: Paranasal sinuses are well aerated. Nasal septum is mildly deviated to the RIGHT. Included mastoid aircells are well aerated. Debris within  the bilateral external auditory canals. SOFT TISSUES: Small LEFT supraorbital/frontal scalp hematoma without subcutaneous gas or radiopaque foreign bodies. CT CERVICAL SPINE FINDINGS- moderately motion degraded examination. ALIGNMENT: Minimal grade 1 C4-5 and C7-T1 anterolisthesis without spondylolysis. Maintenance of cervical lordosis. SKULL BASE AND VERTEBRAE: Cervical vertebral bodies and posterior elements are intact. Severe RIGHT upper cervical facet arthropathy. Intervertebral disc heights preserved with multilevel mild uncovertebral hypertrophy and endplate spurring. C1-2 articulation maintained. No destructive bony lesions. Bilateral C7 ribs. SOFT TISSUES AND SPINAL CANAL: Included prevertebral and paraspinal soft tissues are nonacute. Moderate calcific atherosclerosis of the carotid bulbs. DISC LEVELS: No significant osseous canal stenosis. Moderate RIGHT C3-4 neural foraminal narrowing. UPPER CHEST: Minimal RIGHT apical nodular scarring. OTHER: None. IMPRESSION: CT HEAD: Small LEFT frontal/ supraorbital scalp hematoma. No skull fracture. No acute intracranial process; negative CT HEAD for age. CT MAXILLOFACIAL: No acute fracture or postseptal hematoma. CT CERVICAL SPINE: No acute fracture. Minimal grade 1 C4-5 and C7-T1 anterolisthesis on degenerative basis. Electronically Signed   By: Elon Alas M.D.   On: 09/06/2016 23:42    Review of Systems  Unable to perform ROS: Dementia    Blood pressure (!) 102/50, pulse 65, temperature 97.8 F (36.6 C), temperature source Axillary, resp. rate 19, height _0  (1.626 m), weight 72.6 kg (160 lb), SpO2 99 %. Physical Exam  Vitals reviewed. Constitutional: She is oriented to person, place, and time. She appears well-developed and well-nourished. No distress.  HENT:  Head: Normocephalic and atraumatic.  Mouth/Throat: Oropharynx is clear and moist.  Eyes: Conjunctivae and EOM are normal. Pupils are equal, round, and reactive to light. No scleral  icterus.  Neck: Normal range of motion. Neck supple. No JVD present. No tracheal deviation present. No thyromegaly present.  Cardiovascular: Normal rate, regular rhythm and normal heart sounds.  Exam reveals no gallop and no friction rub.   No murmur heard. Respiratory: Effort normal and breath sounds normal.  GI: Soft. Bowel sounds are normal. She exhibits no distension. There is no tenderness.  Genitourinary:  Genitourinary Comments: Deferred  Musculoskeletal: Normal range of motion. She exhibits edema.  Lymphadenopathy:    She has no cervical adenopathy.  Neurological: She is alert and oriented to person, place, and time. No cranial nerve deficit. She exhibits normal muscle tone.  Skin: Skin is warm and dry. No rash noted. No erythema.  Psychiatric: She has a normal mood and affect. Her behavior is normal. Judgment and thought content normal.     Assessment/Plan This is an 82 year old female admitted for fever fracture. 1. Femur fracture: Left leg; placed in an immobilizer. Orthopedic  surgery is been consulted. Goals pain management for now. Unclear if we'll proceed with surgery. The patient does not have a direct contraindication however she has indications of worsening heart failure. Evaluate with echocardiogram. History of MI as well.  2. CAD: Stable; continue aspirin. Hold prior to surgery. 3. Essential hypertension: Continue metoprolol and fund 4. CHF: Diagnosis is unclear although the patient appears to be needing more Lasix which was recently increased on an outpatient basis. She also has had some oxygen desaturations. Supplemental O2 as needed. Plan as above 5. Dementia: Continue Aricept and Namenda 6. Depression: Continue Zoloft 7. DVT prophylaxis: Heparin. The patient is a full code. Time spent on admission orders and patient care approximately 45 minutes  Harrie Foreman, MD 09/01/2016, 1:45 AM

## 2016-09-01 NOTE — Progress Notes (Signed)
MD notified of pt BP of 117/57 HR 64 and pt due to get lopressor. MD ordered to give dose

## 2016-09-01 NOTE — Progress Notes (Signed)
[facility] - [place] at [facility]                                                                                                                                                                                            Patient Demographics   Marie Gonzalez, is a 82 y.o. female, DOB - February 09, 1925, ZOX:[id number]  Admit date - 09/05/2016   Admitting Physician Arnaldo Natal, MD  Outpatient Primary MD for the patient is No PCP Per Patient   LOS - 0  Subjective:Called by nurse due to patient having low blood pressure in the 60s patient has dementia and and can give limited review of systems. Her daughter is at the bedside who was able to provide me with history.     Review of Systems:   CONSTITUTIONAL: Unable to provide due to her dementia  Vitals:   Vitals:   09/01/16 0431 09/01/16 0745 09/01/16 1126 09/01/16 1342  BP:  (!) 105/41 (!) 65/36 108/88  Pulse:  (!) 57 (!) 131 68  Resp:  18 18   Temp:  98.4 F (36.9 C) 98.9 F (37.2 C) 99.1 F (37.3 C)  TempSrc:  Oral Axillary Oral  SpO2:   93%   Weight: 163 lb (73.9 kg)     Height:        Wt Readings from Last 3 Encounters:  09/01/16 163 lb (73.9 kg)  08/15/16 160 lb (72.6 kg)  04/19/16 160 lb (72.6 kg)     Intake/Output Summary (Last 24 hours) at 09/01/16 1424 Last data filed at 09/01/16 1312  Gross per 24 hour  Intake          1031.33 ml  Output              750 ml  Net           281.33 ml    Physical Exam:   GENERAL: Pleasant-appearing in no apparent distress.  HEAD, EYES, EARS, NOSE AND THROAT: Atraumatic, normocephalic. Extraocular muscles are intact. Pupils equal and reactive to light. Sclerae anicteric. No conjunctival injection. No oro-pharyngeal erythema.  NECK: Supple. There is no jugular venous distention. No bruits, no lymphadenopathy, no thyromegaly.  HEART: Regular rate and rhythm,. Positive murmur at the right sternal border no rubs, no clicks.  LUNGS: Clear to  auscultation bilaterally. No rales or rhonchi. No wheezes.  ABDOMEN: Soft, flat, nontender, nondistended. Has good bowel sounds. No hepatosplenomegaly appreciated.  EXTREMITIES: No evidence of any cyanosis, clubbing, or peripheral edema.  +2 pedal and radial pulses bilaterally.  NEUROLOGIC: The patient is alert, not oriented to person place or time SKIN:  Moist and warm with no rashes appreciated.  Psych: Not anxious LN: No inguinal LN enlargement    Antibiotics   Anti-infectives    Start     Dose/Rate Route Frequency Ordered Stop   08/19/2016 0900  ceFAZolin (ANCEF) IVPB 1 g/50 mL premix     1 g 100 mL/hr over 30 Minutes Intravenous  Once 09/01/16 1157        Medications   Scheduled Meds: . ALPRAZolam  0.25 mg Oral QHS  . [START ON 08/16/2016]  ceFAZolin (ANCEF) IV  1 g Intravenous Once  . docusate sodium  100 mg Oral BID  . donepezil  5 mg Oral Q12H  . gabapentin  300 mg Oral TID  . heparin  5,000 Units Subcutaneous Q8H  . [START ON 09/07/2016] Influenza vac split quadrivalent PF  0.5 mL Intramuscular Tomorrow-1000  . Melatonin  2.5 mg Oral QHS  . memantine  28 mg Oral Daily  . metoprolol tartrate  12.5 mg Oral BID  . multivitamin with minerals  1 tablet Oral Daily  . omega-3 acid ethyl esters  1 g Oral Daily  . oxybutynin  5 mg Oral QHS  . pantoprazole  40 mg Oral Daily  . potassium chloride SA  20 mEq Oral Daily  . sertraline  100 mg Oral Daily   Continuous Infusions: . sodium chloride 100 mL/hr at 09/01/16 1256   PRN Meds:.acetaminophen **OR** acetaminophen, HYDROcodone-acetaminophen, ondansetron **OR** ondansetron (ZOFRAN) IV   Data Review:   Micro Results Recent Results (from the past 240 hour(s))  Surgical pcr screen     Status: None   Collection Time: 09/01/16  4:35 AM  Result Value Ref Range Status   MRSA, PCR NEGATIVE NEGATIVE Final   Staphylococcus aureus NEGATIVE NEGATIVE Final    Comment:        The Xpert SA Assay (FDA approved for NASAL specimens in  patients over 40 years of age), is one component of a comprehensive surveillance program.  Test performance has been validated by [facility] for patients greater than or equal to 21 year old. It is not intended to diagnose infection nor to guide or monitor treatment.     Radiology Reports Dg Chest 2 View  Result Date: 08/15/2016 CLINICAL DATA:  Tachycardia and decreased oxygen saturation EXAM: CHEST  2 VIEW COMPARISON:  06/25/2013 FINDINGS: Cardiac shadow is mildly enlarged. A pacing device is again seen and stable. Lungs are well-aerated with some mild interstitial changes stable from the prior exam and likely chronic in nature. A few small calcifications are noted consistent with prior granulomatous disease. No sizable infiltrate or effusion is seen. No bony abnormality is noted. IMPRESSION: No acute abnormality seen. Electronically Signed   By: Alcide Clever M.D.   On: 08/15/2016 16:00   Dg Pelvis 1-2 Views  Result Date: 09-28-2016 CLINICAL DATA:  Status post fall 3 weeks ago, and unable to bear weight. Initial encounter. EXAM: PELVIS - 1-2 VIEW COMPARISON:  None. FINDINGS: There is no evidence of fracture or dislocation. Both femoral heads are seated normally within their respective acetabula. No significant degenerative change is appreciated. The sacroiliac joints are unremarkable in appearance. The visualized bowel gas pattern is grossly unremarkable in appearance. IMPRESSION: No evidence of fracture or dislocation. Electronically Signed   By: Roanna Raider M.D.   On: 28-Sep-2016 23:48   Ct Head Wo Contrast  Result Date: September 28, 2016 CLINICAL DATA:  Larey Seat 2 days ago, LEFT orbital bruising. Unable to bear weight. History of hypertension, atrial fibrillation.  EXAM: CT HEAD WITHOUT CONTRAST CT MAXILLOFACIAL WITHOUT CONTRAST CT CERVICAL SPINE WITHOUT CONTRAST TECHNIQUE: Multidetector CT imaging of the head, cervical spine, and maxillofacial structures were performed using the standard protocol  without intravenous contrast. Multiplanar CT image reconstructions of the cervical spine and maxillofacial structures were also generated. COMPARISON:  None. FINDINGS: CT HEAD FINDINGS BRAIN: The ventricles and sulci are normal for age. No intraparenchymal hemorrhage, mass effect nor midline shift. Patchy supratentorial white matter hypodensities within normal range for patient's age, though non-specific are most compatible with chronic small vessel ischemic disease. No acute large vascular territory infarcts. No abnormal extra-axial fluid collections. Basal cisterns are patent. VASCULAR: Moderate calcific atherosclerosis of the carotid siphons. SKULL: No skull fracture. No significant scalp soft tissue swelling. OTHER: None. CT MAXILLOFACIAL FINDINGS OSSEOUS: The mandible is intact, the condyles are located. No acute facial fracture. No destructive bony lesions. ORBITS: Ocular globes intact, status post RIGHT ocular lens implant. Normal appearance of the orbital contents. SINUSES: Paranasal sinuses are well aerated. Nasal septum is mildly deviated to the RIGHT. Included mastoid aircells are well aerated. Debris within the bilateral external auditory canals. SOFT TISSUES: Small LEFT supraorbital/frontal scalp hematoma without subcutaneous gas or radiopaque foreign bodies. CT CERVICAL SPINE FINDINGS- moderately motion degraded examination. ALIGNMENT: Minimal grade 1 C4-5 and C7-T1 anterolisthesis without spondylolysis. Maintenance of cervical lordosis. SKULL BASE AND VERTEBRAE: Cervical vertebral bodies and posterior elements are intact. Severe RIGHT upper cervical facet arthropathy. Intervertebral disc heights preserved with multilevel mild uncovertebral hypertrophy and endplate spurring. C1-2 articulation maintained. No destructive bony lesions. Bilateral C7 ribs. SOFT TISSUES AND SPINAL CANAL: Included prevertebral and paraspinal soft tissues are nonacute. Moderate calcific atherosclerosis of the carotid bulbs.  DISC LEVELS: No significant osseous canal stenosis. Moderate RIGHT C3-4 neural foraminal narrowing. UPPER CHEST: Minimal RIGHT apical nodular scarring. OTHER: None. IMPRESSION: CT HEAD: Small LEFT frontal/ supraorbital scalp hematoma. No skull fracture. No acute intracranial process; negative CT HEAD for age. CT MAXILLOFACIAL: No acute fracture or postseptal hematoma. CT CERVICAL SPINE: No acute fracture. Minimal grade 1 C4-5 and C7-T1 anterolisthesis on degenerative basis. Electronically Signed   By: Awilda Metro M.D.   On: 09/07/2016 23:42   Ct Cervical Spine Wo Contrast  Result Date: 09/04/2016 CLINICAL DATA:  Larey Seat 2 days ago, LEFT orbital bruising. Unable to bear weight. History of hypertension, atrial fibrillation. EXAM: CT HEAD WITHOUT CONTRAST CT MAXILLOFACIAL WITHOUT CONTRAST CT CERVICAL SPINE WITHOUT CONTRAST TECHNIQUE: Multidetector CT imaging of the head, cervical spine, and maxillofacial structures were performed using the standard protocol without intravenous contrast. Multiplanar CT image reconstructions of the cervical spine and maxillofacial structures were also generated. COMPARISON:  None. FINDINGS: CT HEAD FINDINGS BRAIN: The ventricles and sulci are normal for age. No intraparenchymal hemorrhage, mass effect nor midline shift. Patchy supratentorial white matter hypodensities within normal range for patient's age, though non-specific are most compatible with chronic small vessel ischemic disease. No acute large vascular territory infarcts. No abnormal extra-axial fluid collections. Basal cisterns are patent. VASCULAR: Moderate calcific atherosclerosis of the carotid siphons. SKULL: No skull fracture. No significant scalp soft tissue swelling. OTHER: None. CT MAXILLOFACIAL FINDINGS OSSEOUS: The mandible is intact, the condyles are located. No acute facial fracture. No destructive bony lesions. ORBITS: Ocular globes intact, status post RIGHT ocular lens implant. Normal appearance of the  orbital contents. SINUSES: Paranasal sinuses are well aerated. Nasal septum is mildly deviated to the RIGHT. Included mastoid aircells are well aerated. Debris within the bilateral external auditory canals. SOFT TISSUES: Small LEFT  supraorbital/frontal scalp hematoma without subcutaneous gas or radiopaque foreign bodies. CT CERVICAL SPINE FINDINGS- moderately motion degraded examination. ALIGNMENT: Minimal grade 1 C4-5 and C7-T1 anterolisthesis without spondylolysis. Maintenance of cervical lordosis. SKULL BASE AND VERTEBRAE: Cervical vertebral bodies and posterior elements are intact. Severe RIGHT upper cervical facet arthropathy. Intervertebral disc heights preserved with multilevel mild uncovertebral hypertrophy and endplate spurring. C1-2 articulation maintained. No destructive bony lesions. Bilateral C7 ribs. SOFT TISSUES AND SPINAL CANAL: Included prevertebral and paraspinal soft tissues are nonacute. Moderate calcific atherosclerosis of the carotid bulbs. DISC LEVELS: No significant osseous canal stenosis. Moderate RIGHT C3-4 neural foraminal narrowing. UPPER CHEST: Minimal RIGHT apical nodular scarring. OTHER: None. IMPRESSION: CT HEAD: Small LEFT frontal/ supraorbital scalp hematoma. No skull fracture. No acute intracranial process; negative CT HEAD for age. CT MAXILLOFACIAL: No acute fracture or postseptal hematoma. CT CERVICAL SPINE: No acute fracture. Minimal grade 1 C4-5 and C7-T1 anterolisthesis on degenerative basis. Electronically Signed   By: Awilda Metro M.D.   On: 08/30/2016 23:42   US Venous Img Lower Unilateral Left  Result Date: 08/15/2016 CLINICAL DATA:  82 year old female with left lower extremity pain. EXAM: Left LOWER EXTREMITY VENOUS DOPPLER ULTRASOUND TECHNIQUE: Gray-scale sonography with graded compression, as well as color Doppler and duplex ultrasound were performed to evaluate the lower extremity deep venous systems from the level of the common femoral vein and including the  common femoral, femoral, profunda femoral, popliteal and calf veins including the posterior tibial, peroneal and gastrocnemius veins when visible. The superficial great saphenous vein was also interrogated. Spectral Doppler was utilized to evaluate flow at rest and with distal augmentation maneuvers in the common femoral, femoral and popliteal veins. COMPARISON:  None. FINDINGS: Contralateral Common Femoral Vein: Respiratory phasicity is normal and symmetric with the symptomatic side. No evidence of thrombus. Normal compressibility. Common Femoral Vein: No evidence of thrombus. Normal compressibility, respiratory phasicity and response to augmentation. Saphenofemoral Junction: No evidence of thrombus. Normal compressibility and flow on color Doppler imaging. Profunda Femoral Vein: No evidence of thrombus. Normal compressibility and flow on color Doppler imaging. Femoral Vein: There is a duplicated appearance of the midportion of the femoral vein. No evidence of thrombus. Normal compressibility, respiratory phasicity and response to augmentation. Popliteal Vein: No evidence of thrombus. Normal compressibility, respiratory phasicity and response to augmentation. Calf Veins: No evidence of thrombus. Normal compressibility and flow on color Doppler imaging. Superficial Great Saphenous Vein: No evidence of thrombus. Normal compressibility and flow on color Doppler imaging. Venous Reflux:  None. Other Findings:  None. IMPRESSION: No evidence of deep venous thrombosis in the left lower extremity. Electronically Signed   By: Elgie Collard M.D.   On: 08/15/2016 18:22   Dg Chest Port 1 View  Result Date: 08/10/2016 CLINICAL DATA:  Preoperative chest radiograph for left femoral fracture. Initial encounter. EXAM: PORTABLE CHEST 1 VIEW COMPARISON:  Chest radiograph from 08/15/2016 FINDINGS: There is elevation of the right hemidiaphragm. Mild peribronchial thickening is noted. Chronically increased interstitial markings  are seen. No pleural effusion or pneumothorax is identified. The cardiomediastinal silhouette is mildly enlarged. No acute osseous abnormalities are identified. A pacemaker lead is noted overlying the right ventricle. IMPRESSION: Elevation of the right hemidiaphragm. Mild peribronchial thickening noted. Chronically increased interstitial markings seen. Mild cardiomegaly. Electronically Signed   By: Roanna Raider M.D.   On: 08/25/2016 23:47   Dg Femur Min 2 Views Left  Result Date: 08/12/2016 CLINICAL DATA:  Status post fall, with left femoral pain. Initial encounter. EXAM: LEFT FEMUR 2  VIEWS COMPARISON:  None. FINDINGS: There is a mildly comminuted fracture of the distal femoral metadiaphysis, with shortening and mild displacement of multiple fragments. The largest distal fragment demonstrates approximately 3 cm of shortening and displacement. Angulation of fragments varies depending on positioning. The fracture likely extends to the intercondylar notch, though this is not definitively characterized. No additional fractures are seen. The patella is grossly unremarkable in appearance. The left femoral head remains seated at the acetabulum. Soft tissue swelling is noted about the fracture site. IMPRESSION: Mildly comminuted fracture of the distal femoral metadiaphysis, with shortening and mild displacement of multiple fragments. Largest distal fragment demonstrates 3 cm of shortening and displacement. Angulation of fragments varies depending on positioning. The fracture likely extends to the intercondylar notch, though this is not definitively characterized. Electronically Signed   By: Roanna Raider M.D.   On: 2016/09/07 23:52   Ct Maxillofacial Wo Contrast  Result Date: 09/07/16 CLINICAL DATA:  Larey Seat 2 days ago, LEFT orbital bruising. Unable to bear weight. History of hypertension, atrial fibrillation. EXAM: CT HEAD WITHOUT CONTRAST CT MAXILLOFACIAL WITHOUT CONTRAST CT CERVICAL SPINE WITHOUT CONTRAST  TECHNIQUE: Multidetector CT imaging of the head, cervical spine, and maxillofacial structures were performed using the standard protocol without intravenous contrast. Multiplanar CT image reconstructions of the cervical spine and maxillofacial structures were also generated. COMPARISON:  None. FINDINGS: CT HEAD FINDINGS BRAIN: The ventricles and sulci are normal for age. No intraparenchymal hemorrhage, mass effect nor midline shift. Patchy supratentorial white matter hypodensities within normal range for patient's age, though non-specific are most compatible with chronic small vessel ischemic disease. No acute large vascular territory infarcts. No abnormal extra-axial fluid collections. Basal cisterns are patent. VASCULAR: Moderate calcific atherosclerosis of the carotid siphons. SKULL: No skull fracture. No significant scalp soft tissue swelling. OTHER: None. CT MAXILLOFACIAL FINDINGS OSSEOUS: The mandible is intact, the condyles are located. No acute facial fracture. No destructive bony lesions. ORBITS: Ocular globes intact, status post RIGHT ocular lens implant. Normal appearance of the orbital contents. SINUSES: Paranasal sinuses are well aerated. Nasal septum is mildly deviated to the RIGHT. Included mastoid aircells are well aerated. Debris within the bilateral external auditory canals. SOFT TISSUES: Small LEFT supraorbital/frontal scalp hematoma without subcutaneous gas or radiopaque foreign bodies. CT CERVICAL SPINE FINDINGS- moderately motion degraded examination. ALIGNMENT: Minimal grade 1 C4-5 and C7-T1 anterolisthesis without spondylolysis. Maintenance of cervical lordosis. SKULL BASE AND VERTEBRAE: Cervical vertebral bodies and posterior elements are intact. Severe RIGHT upper cervical facet arthropathy. Intervertebral disc heights preserved with multilevel mild uncovertebral hypertrophy and endplate spurring. C1-2 articulation maintained. No destructive bony lesions. Bilateral C7 ribs. SOFT TISSUES AND  SPINAL CANAL: Included prevertebral and paraspinal soft tissues are nonacute. Moderate calcific atherosclerosis of the carotid bulbs. DISC LEVELS: No significant osseous canal stenosis. Moderate RIGHT C3-4 neural foraminal narrowing. UPPER CHEST: Minimal RIGHT apical nodular scarring. OTHER: None. IMPRESSION: CT HEAD: Small LEFT frontal/ supraorbital scalp hematoma. No skull fracture. No acute intracranial process; negative CT HEAD for age. CT MAXILLOFACIAL: No acute fracture or postseptal hematoma. CT CERVICAL SPINE: No acute fracture. Minimal grade 1 C4-5 and C7-T1 anterolisthesis on degenerative basis. Electronically Signed   By: Awilda Metro M.D.   On: 09-07-2016 23:42     CBC  Recent Labs Lab Sep 07, 2016 2253  WBC 4.3  HGB 8.2*  HCT 25.0*  PLT 182  MCV 93.9  MCH 30.9  MCHC 32.9  RDW 17.1*  LYMPHSABS 0.4*  MONOABS 0.5  EOSABS 0.1  BASOSABS 0.0  Chemistries   Recent Labs Lab 08/27/2016 2253  NA 137  K 4.3  CL 104  CO2 25  GLUCOSE 134*  BUN 34*  CREATININE 2.01*  CALCIUM 8.0*  AST 22  ALT 18  ALKPHOS 67  BILITOT 0.7   ------------------------------------------------------------------------------------------------------------------ estimated creatinine clearance is 19.1 mL/min (by C-G formula based on SCr of 2.01 mg/dL (H)). ------------------------------------------------------------------------------------------------------------------ No results for input(s): HGBA1C in the last 72 hours. ------------------------------------------------------------------------------------------------------------------ No results for input(s): CHOL, HDL, LDLCALC, TRIG, CHOLHDL, LDLDIRECT in the last 72 hours. ------------------------------------------------------------------------------------------------------------------  Recent Labs  08/17/2016 2253  TSH 2.874    ------------------------------------------------------------------------------------------------------------------ No results for input(s): VITAMINB12, FOLATE, FERRITIN, TIBC, IRON, RETICCTPCT in the last 72 hours.  Coagulation profile  Recent Labs Lab 08/30/2016 2253  INR 1.25    No results for input(s): DDIMER in the last 72 hours.  Cardiac Enzymes  Recent Labs Lab 08/11/2016 2253  TROPONINI 0.05*   ------------------------------------------------------------------------------------------------------------------ Invalid input(s): POCBNP    Assessment & Plan   This is an 82 year old female admitted for fever fracture. 1.Hypotension suspected due to pain medications Monitor blood pressure I will give IV fluid bolus Hold antihypertensives  2. Severe tricuspid regurg with severe pulmonary hypertension with significant right heart strain We will be cautious with her fluids. Patient has had progressive shortness of breath over the past few months likely the cause   3.Femur fracture: Left leg; patient does have severe pulmonary hypertension as well as severe tricuspid regurg at moderate to high risk of surgical complications including death. Her daughter understands the risk but would like her mother to be mobile. Therefore proceed to surgery.  4. CAD: Stable; continue aspirin. Hold prior to surgery. 5.  Essential hypertension: Continue metoprolol and hold if blood pressure low  6. . Dementia: Continue Aricept and Namenda 7. . Depression: Continue Zoloft 8. DVT prophylaxis: Heparin. 9. CODE STATUS: Case discussed with the daughter at bedside she is agreeable for patient to be a DO NOT RESUSCITATE status in light of above findings on echocardiogram done today.      Code Status Orders        Start     Ordered   09/01/16 1228  Do not attempt resuscitation (DNR)  Continuous    Question Answer Comment  In the event of cardiac or respiratory ARREST Do not call a "code  blue"   In the event of cardiac or respiratory ARREST Do not perform Intubation, CPR, defibrillation or ACLS   In the event of cardiac or respiratory ARREST Use medication by any route, position, wound care, and other measures to relive pain and suffering. May use oxygen, suction and manual treatment of airway obstruction as needed for comfort.      09/01/16 1227    Code Status History    Date Active Date Inactive Code Status Order ID Comments User Context   09/01/2016  2:58 AM 09/01/2016  9:12 AM Full Code [id number]  Arnaldo Natal, MD Inpatient    Advance Directive Documentation   Flowsheet Row Most Recent Value  Type of Advance Directive  Healthcare Power of Attorney  Pre-existing out of facility DNR order (yellow form or pink MOST form)  No data  "MOST" Form in Place?  No data           ConsultsOrthopedic DVT Prophylaxis SCDs  Lab Results  Component Value Date   PLT 182 08/21/2016     Time Spent in minutes   Greater than 50% of time spent in care coordination and  counseling patient regarding the condition and plan of care.   Auburn BilberryPATEL, Traylon Schimming M.D on 09/01/2016 at 2:24 PM  Between 7am to 6pm - Pager - [phone]  After 6pm go to [email or web address] - password [email or web address] [facility]  [facility]   Office  [phone]

## 2016-09-01 NOTE — Progress Notes (Signed)
*  PRELIMINARY RESULTS* Echocardiogram 2D Echocardiogram has been performed.  Marie Gonzalez 09/01/2016, 9:44 AM

## 2016-09-01 NOTE — Progress Notes (Addendum)
MD paged to notify of medication lopressor due and vital signs. Waiting for return call

## 2016-09-02 ENCOUNTER — Inpatient Hospital Stay: Payer: Medicare Other | Admitting: Anesthesiology

## 2016-09-02 ENCOUNTER — Inpatient Hospital Stay: Payer: Medicare Other

## 2016-09-02 ENCOUNTER — Encounter: Payer: Self-pay | Admitting: Anesthesiology

## 2016-09-02 ENCOUNTER — Encounter: Admission: EM | Disposition: E | Payer: Self-pay | Source: Home / Self Care | Attending: Internal Medicine

## 2016-09-02 HISTORY — PX: ORIF FEMUR FRACTURE: SHX2119

## 2016-09-02 LAB — HEMOGLOBIN A1C
Hgb A1c MFr Bld: 6 % — ABNORMAL HIGH (ref 4.8–5.6)
Mean Plasma Glucose: 126 mg/dL

## 2016-09-02 LAB — CBC
HEMATOCRIT: 23.6 % — AB (ref 35.0–47.0)
Hemoglobin: 7.9 g/dL — ABNORMAL LOW (ref 12.0–16.0)
MCH: 31.4 pg (ref 26.0–34.0)
MCHC: 33.4 g/dL (ref 32.0–36.0)
MCV: 93.9 fL (ref 80.0–100.0)
Platelets: 158 10*3/uL (ref 150–440)
RBC: 2.52 MIL/uL — ABNORMAL LOW (ref 3.80–5.20)
RDW: 17.2 % — AB (ref 11.5–14.5)
WBC: 2.8 10*3/uL — ABNORMAL LOW (ref 3.6–11.0)

## 2016-09-02 LAB — URINE CULTURE
Culture: NO GROWTH
Special Requests: NORMAL

## 2016-09-02 LAB — BASIC METABOLIC PANEL
Anion gap: 7 (ref 5–15)
BUN: 40 mg/dL — AB (ref 6–20)
CALCIUM: 7.4 mg/dL — AB (ref 8.9–10.3)
CO2: 20 mmol/L — AB (ref 22–32)
CREATININE: 2.3 mg/dL — AB (ref 0.44–1.00)
Chloride: 109 mmol/L (ref 101–111)
GFR calc non Af Amer: 18 mL/min — ABNORMAL LOW (ref >60)
GFR, EST AFRICAN AMERICAN: 21 mL/min — AB (ref >60)
Glucose, Bld: 93 mg/dL (ref 65–99)
Potassium: 5.7 mmol/L — ABNORMAL HIGH (ref 3.5–5.1)
SODIUM: 136 mmol/L (ref 135–145)

## 2016-09-02 SURGERY — OPEN REDUCTION INTERNAL FIXATION (ORIF) DISTAL FEMUR FRACTURE
Anesthesia: General | Laterality: Left

## 2016-09-02 MED ORDER — PROPOFOL 10 MG/ML IV BOLUS
INTRAVENOUS | Status: DC | PRN
  Administered 2016-09-02: 60 mg via INTRAVENOUS

## 2016-09-02 MED ORDER — SODIUM POLYSTYRENE SULFONATE 15 GM/60ML PO SUSP
15.0000 g | Freq: Once | ORAL | Status: AC
  Administered 2016-09-02: 15 g via RECTAL
  Filled 2016-09-02: qty 60

## 2016-09-02 MED ORDER — SODIUM CHLORIDE 0.9 % IV BOLUS (SEPSIS)
500.0000 mL | Freq: Once | INTRAVENOUS | Status: AC
  Administered 2016-09-02: 500 mL via INTRAVENOUS

## 2016-09-02 MED ORDER — LACTATED RINGERS IV SOLN
INTRAVENOUS | Status: DC | PRN
  Administered 2016-09-02: 08:00:00 via INTRAVENOUS

## 2016-09-02 MED ORDER — SODIUM POLYSTYRENE SULFONATE 15 GM/60ML PO SUSP: 15.0000 g | Freq: Once | ORAL | Status: DC

## 2016-09-02 MED ORDER — VASOPRESSIN 20 UNIT/ML IV SOLN
INTRAVENOUS | Status: DC | PRN
  Administered 2016-09-02 (×2): 20 [IU] via INTRAVENOUS

## 2016-09-02 MED ORDER — NEOMYCIN-POLYMYXIN B GU 40-200000 IR SOLN
Status: AC
  Filled 2016-09-02: qty 2

## 2016-09-02 MED ORDER — ROCURONIUM BROMIDE 100 MG/10ML IV SOLN
INTRAVENOUS | Status: DC | PRN
  Administered 2016-09-02: 50 mg via INTRAVENOUS

## 2016-09-02 MED ORDER — LIDOCAINE HCL (CARDIAC) 20 MG/ML IV SOLN
INTRAVENOUS | Status: DC | PRN
  Administered 2016-09-02: 40 mg via INTRAVENOUS

## 2016-09-02 MED ORDER — FUROSEMIDE 10 MG/ML IJ SOLN
40.0000 mg | Freq: Once | INTRAMUSCULAR | Status: AC
  Administered 2016-09-02: 40 mg via INTRAVENOUS
  Filled 2016-09-02: qty 4

## 2016-09-02 MED ORDER — EPHEDRINE SULFATE 50 MG/ML IJ SOLN
INTRAMUSCULAR | Status: DC | PRN
  Administered 2016-09-02: 10 mg via INTRAVENOUS

## 2016-09-02 MED ORDER — VASOPRESSIN 20 UNIT/ML IV SOLN
INTRAVENOUS | Status: AC
  Filled 2016-09-02: qty 2

## 2016-09-02 MED ORDER — GLYCOPYRROLATE 0.2 MG/ML IJ SOLN
INTRAMUSCULAR | Status: DC | PRN
Start: 2016-09-02 — End: 2016-09-02
  Administered 2016-09-02: 0.2 mg via INTRAVENOUS

## 2016-09-02 MED ORDER — EPINEPHRINE HCL 1 MG/ML IJ SOLN
INTRAMUSCULAR | Status: DC | PRN
  Administered 2016-09-02 (×2): .1 mg via INTRAVENOUS
  Administered 2016-09-02: .5 mg via INTRAVENOUS
  Administered 2016-09-02 (×2): 1 mg via INTRAVENOUS
  Administered 2016-09-02: .5 mg via INTRAVENOUS

## 2016-09-02 MED ORDER — PHENYLEPHRINE HCL 10 MG/ML IJ SOLN
INTRAMUSCULAR | Status: DC | PRN
  Administered 2016-09-02: 200 ug via INTRAVENOUS
  Administered 2016-09-02: 100 ug via INTRAVENOUS

## 2016-09-02 MED ORDER — SODIUM BICARBONATE 8.4 % IV SOLN
INTRAVENOUS | Status: DC | PRN
  Administered 2016-09-02: 50 meq via INTRAVENOUS

## 2016-09-02 SURGICAL SUPPLY — 29 items
CANISTER SUCT 1200ML W/VALVE (MISCELLANEOUS) IMPLANT
CHLORAPREP W/TINT 26ML (MISCELLANEOUS) IMPLANT
DRAPE C-ARM XRAY 36X54 (DRAPES) IMPLANT
DRAPE C-ARMOR (DRAPES) IMPLANT
ELECT REM PT RETURN 9FT ADLT (ELECTROSURGICAL)
ELECTRODE REM PT RTRN 9FT ADLT (ELECTROSURGICAL) IMPLANT
GAUZE PETRO XEROFOAM 1X8 (MISCELLANEOUS) IMPLANT
GAUZE SPONGE 4X4 12PLY STRL (GAUZE/BANDAGES/DRESSINGS) IMPLANT
GLOVE SURG SYN 9.0  PF PI (GLOVE)
GLOVE SURG SYN 9.0 PF PI (GLOVE) IMPLANT
GOWN SRG 2XL LVL 4 RGLN SLV (GOWNS) IMPLANT
GOWN STRL NON-REIN 2XL LVL4 (GOWNS)
GOWN STRL REUS W/ TWL LRG LVL3 (GOWN DISPOSABLE) IMPLANT
GOWN STRL REUS W/TWL LRG LVL3 (GOWN DISPOSABLE)
HEMOVAC 400ML (MISCELLANEOUS)
IMMBOLIZER KNEE 19 BLUE UNIV (SOFTGOODS) ×3 IMPLANT
KIT DRAIN HEMOVAC JP 7FR 400ML (MISCELLANEOUS) IMPLANT
KIT RM TURNOVER STRD PROC AR (KITS) IMPLANT
MAT BLUE FLOOR 46X72 FLO (MISCELLANEOUS) IMPLANT
NEEDLE FILTER BLUNT 18X 1/2SAF (NEEDLE)
NEEDLE FILTER BLUNT 18X1 1/2 (NEEDLE) IMPLANT
NS IRRIG 500ML POUR BTL (IV SOLUTION) IMPLANT
PACK TOTAL KNEE (MISCELLANEOUS) IMPLANT
STAPLER SKIN PROX 35W (STAPLE) IMPLANT
SUT VIC AB 0 CT1 36 (SUTURE) IMPLANT
SUT VIC AB 2-0 CT1 27 (SUTURE)
SUT VIC AB 2-0 CT1 TAPERPNT 27 (SUTURE) IMPLANT
SYR 5ML LL (SYRINGE) IMPLANT
TAPE MICROFOAM 4IN (TAPE) IMPLANT

## 2016-09-03 ENCOUNTER — Encounter: Payer: Self-pay | Admitting: Orthopedic Surgery

## 2016-09-04 MED FILL — Medication: Qty: 1 | Status: AC

## 2016-09-09 NOTE — Progress Notes (Signed)
Asencion PartridgeKeith Harris, [place] notified of patient's passing in OR.

## 2016-09-09 NOTE — Transfer of Care (Signed)
Immediate Anesthesia Transfer of Care Note  Patient: Fonnie BirkenheadJoyce Likins  Procedure(s) Performed: Procedure(s): OPEN REDUCTION INTERNAL FIXATION (ORIF) DISTAL FEMUR FRACTURE procedure cancelled (Left)  Patient Location: PACU and Short Stay  Anesthesia Type:GETT  Level of Consciousness: unresponsive  Airway & Oxygen Therapy: pt expired  Post-op Assessment: expired  Post vital signs: Reviewed  Last Vitals:  Vitals:   2016/07/08 0419 2016/07/08 0424  BP: (!) 90/43 (!) 98/48  Pulse: 66   Resp: 20   Temp: 37.3 C     Last Pain:  Vitals:   2016/07/08 0419  TempSrc: Oral  PainSc:          Complications: death

## 2016-09-09 NOTE — Progress Notes (Signed)
Patient moved from OR 8 to SDS 10 for family viewing.

## 2016-09-09 NOTE — Progress Notes (Signed)
Code was discontinued at 0902 per family request.

## 2016-09-09 NOTE — Progress Notes (Addendum)
MD notified of orders for lasix and kayexalate put in per prime MD OR wanting to notify prior to administering. MENZ acknowledged and said to proceed. Let Marie Gonzalez in OR know

## 2016-09-09 NOTE — Progress Notes (Signed)
Diamond MD notified of pt bp of 98/48 and npo status. Ordered to cont to adm lasix and MD changing kayexalate order to rectal

## 2016-09-09 NOTE — Progress Notes (Signed)
Kayexalate administer and rectal tube clamped. Notified OR that clamp needs to unclamped at 0805

## 2016-09-09 NOTE — Progress Notes (Signed)
At 0856am Code Blue called again to OR 8.  Patient in PEA. Upon arrival CPR/ACLS protocols being followed by OR team. Dr. Randa NgoPiscitello running code and Dr. Rosita KeaMenz at bedside. Dr. Dema SeverinMungal - intensivist went to speak with family.Code discontinued per family request.

## 2016-09-09 NOTE — Final Progress Note (Signed)
Patient coded again in OR and could not be resuscitated.  No pulse or respiration at 9:06.  Had discussed situation with daughter at the time of second code and she understood expected outcome.

## 2016-09-09 NOTE — Anesthesia Preprocedure Evaluation (Addendum)
Anesthesia Evaluation  Patient identified by MRN, date of birth, ID band Patient confused    Reviewed: Allergy & Precautions, H&P , NPO status , Patient's Chart, lab work & pertinent test results  History of Anesthesia Complications Negative for: history of anesthetic complications  Airway Mallampati: III  TM Distance: <3 FB Neck ROM: limited    Dental no notable dental hx. (+) Poor Dentition, Chipped, Missing, Edentulous Upper, Partial Lower, Upper Dentures   Pulmonary neg pulmonary ROS, shortness of breath, former smoker,    Pulmonary exam normal breath sounds clear to auscultation       Cardiovascular Exercise Tolerance: Poor hypertension, (-) angina+ Past MI  Normal cardiovascular exam+ dysrhythmias Atrial Fibrillation + pacemaker + Valvular Problems/Murmurs  Rhythm:regular Rate:Normal     Neuro/Psych PSYCHIATRIC DISORDERS Anxiety Depression CVA, Residual Symptoms negative neurological ROS     GI/Hepatic Neg liver ROS, GERD  Controlled,  Endo/Other  negative endocrine ROS  Renal/GU ARFRenal disease     Musculoskeletal   Abdominal   Peds  Hematology negative hematology ROS (+)   Anesthesia Other Findings Past Medical History: No date: A-fib [facility])     Comment: Confirmed with pt's cardiology office - Holter              with tachybrady -> went on to have pacemaker No date: Anemia     Comment: Prior h/o such, per [facility] cardiologist's               office No date: Anxiety No date: Atrial fibrillation (HCC) No date: Dementia No date: Depression No date: GERD (gastroesophageal reflux disease) No date: Hypertension No date: Myocardial infarction (HCC) No date: Osteoporosis No date: Sick sinus syndrome (HCC)     Comment: Pacemaker (Medtronic) - VVIR placed 2012 in               [place], [place] & was put on Pradaxa  Past Surgical History: No date: ABDOMINAL HYSTERECTOMY     Comment: partial No date:  APPENDECTOMY No date: BREAST CYST EXCISION No date: HERNIA REPAIR 06/26/2013: LEFT HEART CATHETERIZATION WITH CORONARY ANGIO* N/A     Comment: Procedure: LEFT HEART CATHETERIZATION WITH               CORONARY ANGIOGRAM;  Surgeon: Kathleene Hazel, MD;  Location: [facility] CATH LAB;  Service:              Cardiovascular;  Laterality: N/A; No date: PACEMAKER INSERTION     Comment: 2012 No date: RECTOCELE REPAIR     Comment: In 2000 - had complications from surgery -               abdominal organs couldn't function, in critical              condition & in hospital for 2 weeks No date: TONSILLECTOMY  BMI    Body Mass Index:  31.12 kg/m      Reproductive/Obstetrics negative OB ROS                           Anesthesia Physical Anesthesia Plan  ASA: IV  Anesthesia Plan: General ETT   Post-op Pain Management:    Induction:   Airway Management Planned:   Additional Equipment:   Intra-op Plan:   Post-operative Plan:   Informed Consent: I have reviewed the patients History and Physical, chart, labs and discussed  the procedure including the risks, benefits and alternatives for the proposed anesthesia with the patient or authorized representative who has indicated his/her understanding and acceptance.     Plan Discussed with: Anesthesiologist, CRNA and Surgeon  Anesthesia Plan Comments: (Patient had hypotensive episode yesterday and with sever tricuspid regurgication I feel that GA is safer than spinal for this patient.   Patient has medical clearance for this procedure.  ECHO read yesterday was read as improved EF.  Patient and family informed that patient is higher risk for complications from anesthesia during this procedure due to their medical history and age including but not limited to post operative cognitive dysfunction and post op ventilation.  They voiced understanding. )      Anesthesia Quick Evaluation

## 2016-09-09 NOTE — Progress Notes (Signed)
   September 26, 2016 1000  Clinical Encounter Type  Visited With Family;Health care provider  Visit Type Critical Care;Code;Death  Referral From Physician;Nurse  Spiritual Encounters  Spiritual Needs Grief support;Emotional;Prayer  Stress Factors  Family Stress Factors Loss  CH responded to Code Blue in OR and moved family from surgical waiting area to ICU waiting area; Second Code Blue and pt death in [place]; [place] along with MD offered support to family; [facility] liaison with RN and staff to have family visit with pt in PACU; [place] met additional family and escorted all to room; [place] offered grief and emotional support as well as prayer.  [place] aided RN staff to acquire necessary funeral home info and [facility] contact; Family requesting a brain autopsy; [place] escorted family to exit. Candis Schatz Orrin Yurkovich 11:00 AM

## 2016-09-09 NOTE — OR Nursing (Signed)
Patient was moved to SDS 10 for family viewing and Dr. Rosita KeaMenz talked to family.

## 2016-09-09 NOTE — Anesthesia Postprocedure Evaluation (Signed)
Anesthesia Post Note  Patient: Marie Gonzalez  Procedure(s) Performed: Procedure(s) (LRB): OPEN REDUCTION INTERNAL FIXATION (ORIF) DISTAL FEMUR FRACTURE procedure cancelled (Left)  Patient location during evaluation: PACU Anesthesia Type: General Anesthetic complications: yes Comments: Patient had multiple code events after induction and intubation.  2nd code was terminated by family.  Please see anesthetic note for further detail.    Last Vitals:  Vitals:   08/18/2016 0419 09/08/2016 0424  BP: (!) 90/43 (!) 98/48  Pulse: 66   Resp: 20   Temp: 37.3 C     Last Pain:  Vitals:   09/01/2016 0419  TempSrc: Oral  PainSc:                  [name]

## 2016-09-09 NOTE — Progress Notes (Signed)
Code Blue called to OR 8 at 0819. Upon arrival of myself and code team, patient on OR table intubated. Anesthesiologist - Dr. Randa NgoPiscitello running code. He stated, had just started giving meds to put patient under when she went into V.fib. CPR/ACLS protocols  initiated immediately by OR team. Patient had been defibrillated x1 prior to code team arrival. Dr. Rosita KeaMenz present at bedside. See Code [employer] Sheet. Patient regained pulse at 0825am. Dr. Rosita KeaMenz went to speak with family.

## 2016-09-09 NOTE — ED Provider Notes (Signed)
[facility]  Department of Emergency Medicine   Code Blue CONSULT NOTE  Chief Complaint: Cardiac arrest/unresponsive   Level V Caveat: Unresponsive  History of present illness: I was contacted by the hospital for a CODE BLUE cardiac arrest upstairs and presented to the patient's bedside.   ROS: Unable to obtain, Level V caveat  Scheduled Meds: . [MAR Hold] ALPRAZolam  0.25 mg Oral QHS  . [MAR Hold]  ceFAZolin (ANCEF) IV  1 g Intravenous Once  . [MAR Hold] docusate sodium  100 mg Oral BID  . [MAR Hold] donepezil  5 mg Oral Q12H  . [MAR Hold] gabapentin  300 mg Oral TID  . [MAR Hold] heparin  5,000 Units Subcutaneous Q8H  . [MAR Hold] Influenza vac split quadrivalent PF  0.5 mL Intramuscular Tomorrow-1000  . [MAR Hold] Melatonin  2.5 mg Oral QHS  . [MAR Hold] memantine  28 mg Oral Daily  . [MAR Hold] metoprolol tartrate  12.5 mg Oral BID  . [MAR Hold] multivitamin with minerals  1 tablet Oral Daily  . [MAR Hold] omega-3 acid ethyl esters  1 g Oral Daily  . [MAR Hold] oxybutynin  5 mg Oral QHS  . [MAR Hold] pantoprazole  40 mg Oral Daily  . [MAR Hold] sertraline  100 mg Oral Daily   Continuous Infusions: . sodium chloride 100 mL/hr at 08/15/2016 0305   PRN Meds:.[MAR Hold] acetaminophen **OR** [MAR Hold] acetaminophen, [MAR Hold] HYDROcodone-acetaminophen, [MAR Hold] ondansetron **OR** [MAR Hold] ondansetron (ZOFRAN) IV Past Medical History:  Diagnosis Date  . A-fib (HCC)    Confirmed with pt's cardiology office - Holter with tachybrady -> went on to have pacemaker  . Anemia    Prior h/o such[facility]  . Anxiety   . Atrial fibrillation (HCC)   . Dementia   . Depression   . GERD (gastroesophageal reflux disease)   . Hypertension   . Myocardial infarction (HCC)   . Osteoporosis   . Sick sinus syndrome [facility](HCC)    Pacemaker (Medtronic) - [place] & was put on Pradaxa   Past Surgical History:  Procedure  Laterality Date  . ABDOMINAL HYSTERECTOMY     partial  . APPENDECTOMY    . BREAST CYST EXCISION    . HERNIA REPAIR    . LEFT HEART CATHETERIZATION WITH CORONARY ANGIOGRAM N/A 06/26/2013   Procedure: LEFT HEART CATHETERIZATION WITH CORONARY ANGIOGRAM;  Surgeon: Kathleene Hazelhristopher D McAlhany, MD;  [facility];  Service: Cardiovascular;  Laterality: N/A;  . PACEMAKER INSERTION     2012  . RECTOCELE REPAIR     In 2000 - had complications from surgery - abdominal organs couldn't function, in critical condition & in hospital for 2 weeks  . TONSILLECTOMY     Social History   Social History  . Marital status: Widowed    Spouse name: N/A  . Number of children: N/A  . Years of education: N/A   Occupational History  . Not on file.   Social History Main Topics  . Smoking status: Former Smoker    Packs/day: 1.00    Years: 25.00  . Smokeless tobacco: Never Used  . Alcohol use No  . Drug use: No  . Sexual activity: Not on file   Other Topics Concern  . Not on file   Social History Narrative  . No narrative on file   Allergies  Allergen Reactions  . Sulfa Antibiotics Rash    Last set  of Vital Signs (not current) Vitals:   09/08/2016 0419 08/25/2016 0424  BP: (!) 90/43 (!) 98/48  Pulse: 66   Resp: 20   Temp: 99.2 F (37.3 C)       Physical Exam  Gen: unresponsive Cardiovascular: pulseless  Resp: apneic.Chest rise with ventilator Abd: nondistended  Neuro: GCS 3, unresponsive to pain  HEENT: No traumatic findings Neck: No crepitus  Skin: warm    Medical Decision making  I arrived to OR where anesthesiologist Dr. Randa Ngo was running code.  Reportedly for ortho procedure.  Already intubated, good chest rise and o2 sat 80s.  Chest compressions and epi given already.  VT shock x1.  Discussed recent slightly elevated K (around 5) and suggested to consider bicarb and/or calcium.  Return of spontaneous circulation.  Dr. Randa Ngo attending responsibility for patient,  and I returned to ER.  Assessment and Plan  Plan per attending physician.    Governor Rooks, MD 08/24/[id number]

## 2016-09-09 NOTE — Anesthesia Procedure Notes (Signed)
Procedure Name: Intubation Date/Time: June 24, 2016 8:16 AM Performed by: Mathews ArgyleLOGAN, Marie Gonzalez Pre-anesthesia Checklist: Patient identified, Patient being monitored, Timeout performed, Emergency Drugs available and Suction available Patient Re-evaluated:Patient Re-evaluated prior to inductionOxygen Delivery Method: Circle system utilized Preoxygenation: Pre-oxygenation with 100% oxygen Intubation Type: IV induction Ventilation: Mask ventilation without difficulty Laryngoscope Size: Miller and 2 Grade View: Grade I Tube type: Oral Tube size: 7.0 mm Number of attempts: 1 Airway Equipment and Method: Stylet Placement Confirmation: ETT inserted through vocal cords under direct vision,  positive ETCO2 and breath sounds checked- equal and bilateral Secured at: 21 cm Tube secured with: Tape Dental Injury: Teeth and Oropharynx as per pre-operative assessment

## 2016-09-09 NOTE — OR Nursing (Signed)
Patient coded after being induction and intubation. Procedure was cancelled

## 2016-09-09 NOTE — OR Nursing (Signed)
2nd code blue called 0856. Compressions were stopped at 0902.

## 2016-09-09 NOTE — Op Note (Signed)
08/29/2016 - September 21, 2016  8:37 AM  PATIENT:  Fonnie BirkenheadJoyce Berne  82 y.o. female  PRE-OPERATIVE DIAGNOSIS:  left distal  femur fracture  POST-OPERATIVE DIAGNOSIS:  * No post-op diagnosis entered *  PROCEDURE:  Procedure(s): OPEN REDUCTION INTERNAL FIXATION (ORIF) DISTAL FEMUR FRACTURE (Left)  SURGEON: Leitha SchullerMichael J Jung Yurchak, MD  ASSISTANTS: None  ANESTHESIA:   general  EBL:  No intake/output data recorded.  BLOOD ADMINISTERED:none  DICTATION: .Dragon Dictation patient was brought to the operating room and placed on the operative table. After induction she went into the fibrillation and was coated successfully with intubation medication chest compressions and cardioversion. She'll be transferred to CCU. no surgery was performed on the leg, a leg immobilizer was placed to try to minimize motion at the fracture site  PLAN OF CARE: Continue as inpatient  PATIENT DISPOSITION:  ICU - intubated and critically ill.

## 2016-09-09 NOTE — Discharge Summary (Signed)
Physician Discharge Summary   Patient ID: Fonnie BirkenheadJoyce Sabol [id number] 82 y.o. 11/21/1925  Admit date: 07-13-2016  Discharge date and time: Patient died at 9:06 a.m. on 9/24  Admitting Physician: Arnaldo NatalMichael S Diamond, MD   Discharge Physician: Kennedy BuckerMichael Danarius Mcconathy, MD  Admission Diagnoses: fall left distal  femur   Discharge Diagnoses: Left femur fracture, cardiac arrest  Admission Condition: poor  Discharged Condition: Expired  Indication for Admission: Fracture left distal femur  Hospital Course: Patient was admitted and underwent medical evaluation. After discussion with family has decided to go ahead and fix the distal femur for pain relief and allow her to be mobilized. Upon induction patient went into V. fib and was coated, and resuscitated. She coated again a short time later and after discussion with family lift the patient was unlikely to survive the day, the code ended with patient expiring.    Signed: Jule Schlabach 08/18/2016 10:54 AM

## 2016-09-09 NOTE — Progress Notes (Signed)
Family gone. Dennison Callasarolyn Duncan, NT/Orderly transported patient to morgue. Asencion PartridgeKeith Harris ME to morgue to examine body.

## 2016-09-09 NOTE — Progress Notes (Signed)
Patient left for OR this morning shortly after shift change.

## 2016-09-09 NOTE — Discharge Summary (Signed)
Death summary   Date of Admission: 09/03/2016 10:24 PM  Date of death: 9/24/9:06 AM   Admitting diagnosis: Hip fracture and a fall     Diagnosis at time of death* 1. V. fib cardiac arrest 2. Severe pulmonary hypertension 3. Severe tricuspid regurg 4. Severe right-sided heart strain 5. Left femur fracture      Hospital course Patient was a 82 year old female who was admitted after a fall and was noted to have x-ray that showed a femur fracture. Prior to her admission patient has had progressive shortness of breath. She was admitted to the medicine service. She underwent echocardiogram that showed severe pulmonary hypertension evident swell as severe tricuspid regurg. It was discussed with the family regarding the risk of surgery. And her daughter decided that patient should undergo surgery otherwise she would be bedbound and would make her condition worse. She was taken to the OR today where she had V. fib arrest. Attempts at resuscitation were unsuccessful. And patient passed away.          TOTAL TIME TAKING CARE OF THIS PATIENT: 22 minutes.    Auburn BilberryPATEL, Lillymae Duet M.D on 02-15-16 at 1:29 PM  Between 7am to 6pm - Pager - [phone]  After 6pm go to [email or web address] - password [email or web address] [facility]  [facility]  Office  [phone]  CC: Primary care physician; No PCP Per Patient

## 2016-09-09 DEATH — deceased

## 2017-12-07 IMAGING — CT CT MAXILLOFACIAL W/O CM
4 of 10 series · 15 of 47 positions shown, 17 images · non-contrast
Comparison: None.

CLINICAL DATA: Fell 2 days ago, LEFT orbital bruising. Unable to
bear weight. History of hypertension, atrial fibrillation.

EXAM:
CT HEAD WITHOUT CONTRAST
CT MAXILLOFACIAL WITHOUT CONTRAST
CT CERVICAL SPINE WITHOUT CONTRAST
TECHNIQUE: Multidetector CT imaging of the head, cervical spine, and
maxillofacial structures were performed using the standard protocol
without intravenous contrast. Multiplanar CT image reconstructions
of the cervical spine and maxillofacial structures were also
generated.

[Series 5: sagittal soft tissue · sagittal · 0.27mm/px · 1 of 48 slices shown]
[im 24/48  bone]
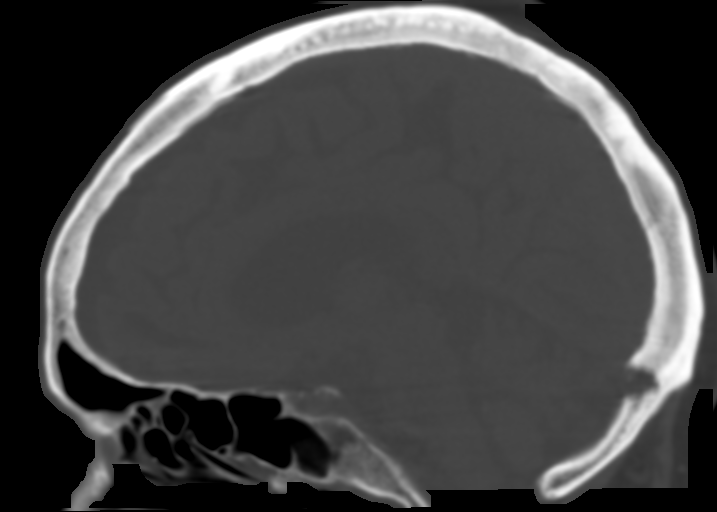

[Series 7: c spine soft · axial · 0.28mm/px · z∈[-209,-147]mm · 4 of 84 slices shown]
[im 11/84  brain]
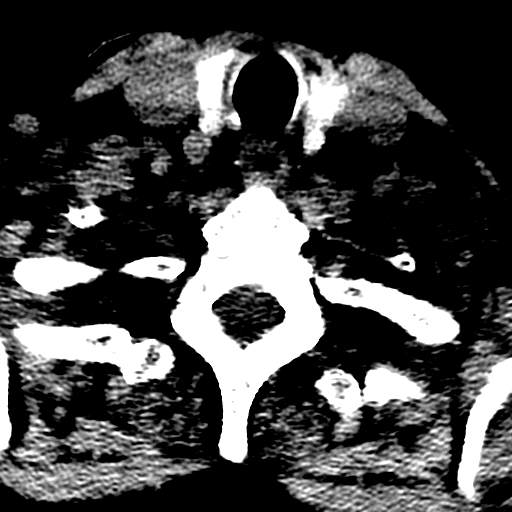
[im 21/84  brain]
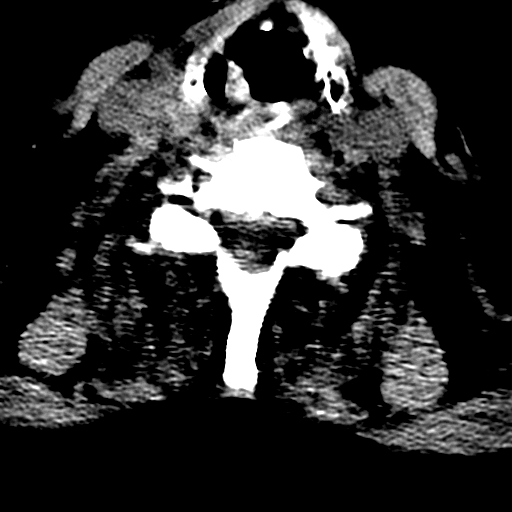
[im 32/84  brain]
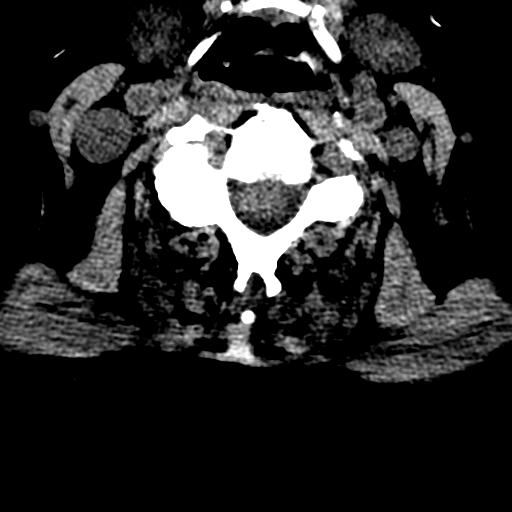
[im 42/84  brain]
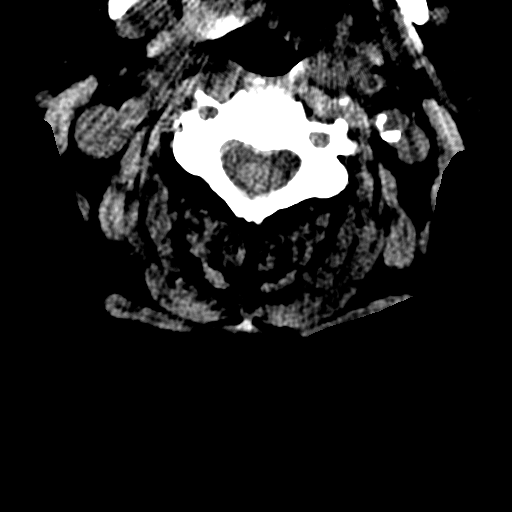

[Series 10: orthogonal axials · axial · 0.23mm/px · z∈[-233,-115]mm · 7 of 86 slices shown, 9 images]
[im 11/86  brain]
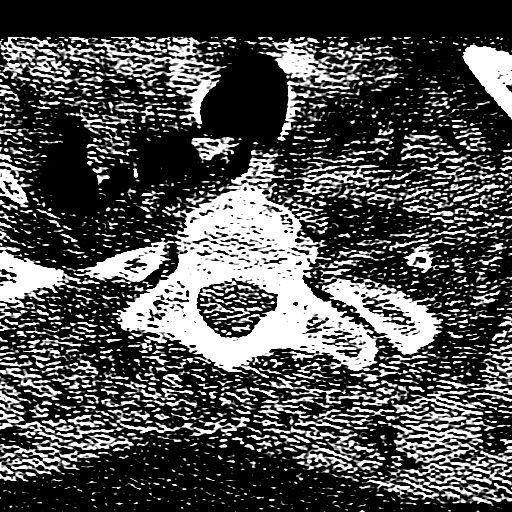
[im 11/86  bone]
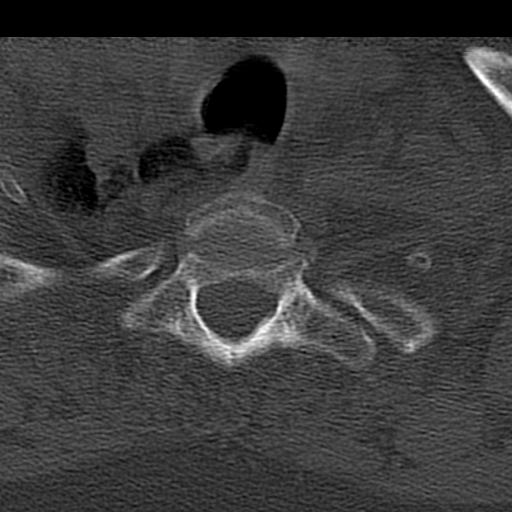
[im 22/86  bone]
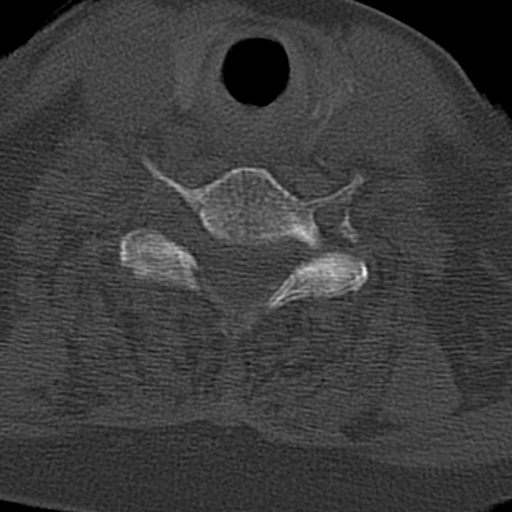
[im 32/86  bone]
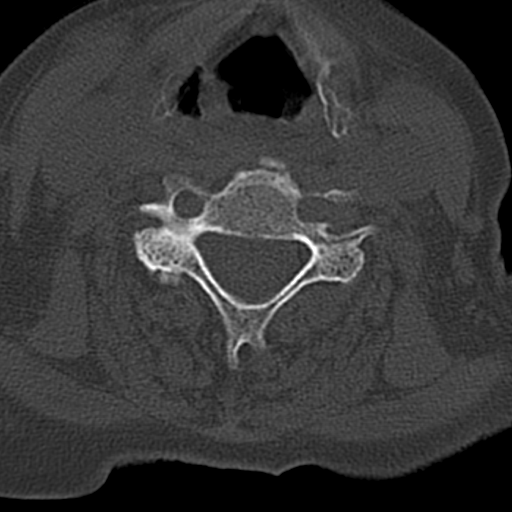
[im 43/86  bone]
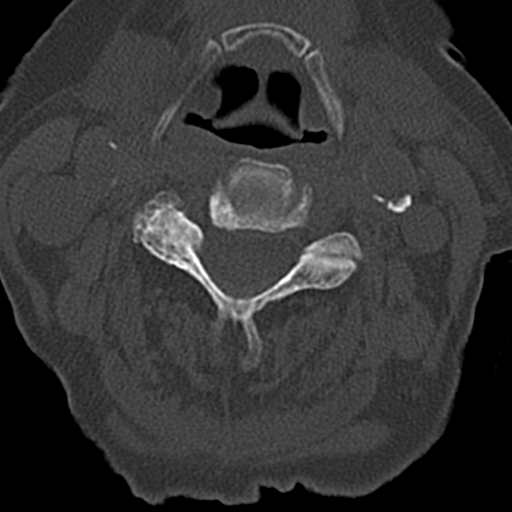
[im 54/86  brain]
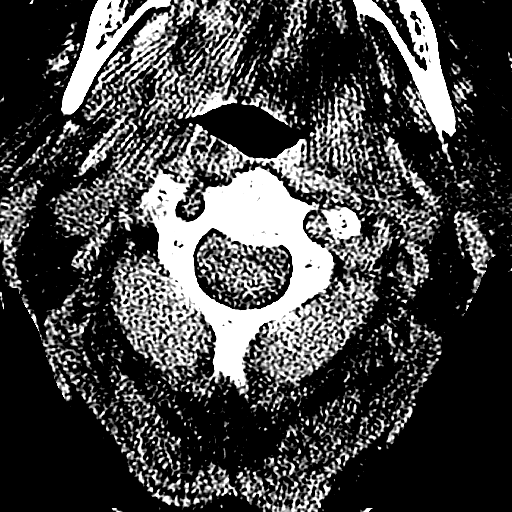
[im 54/86  bone]
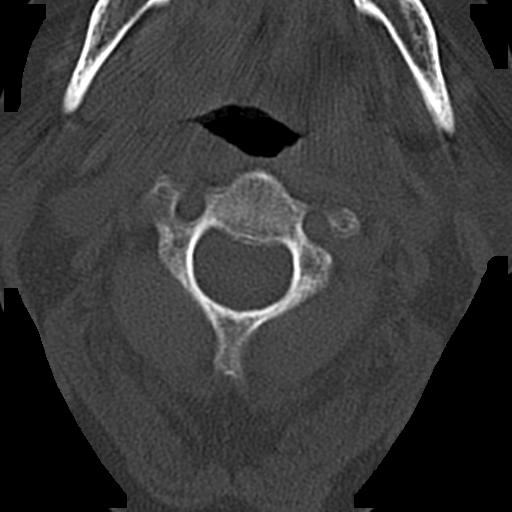
[im 64/86  bone]
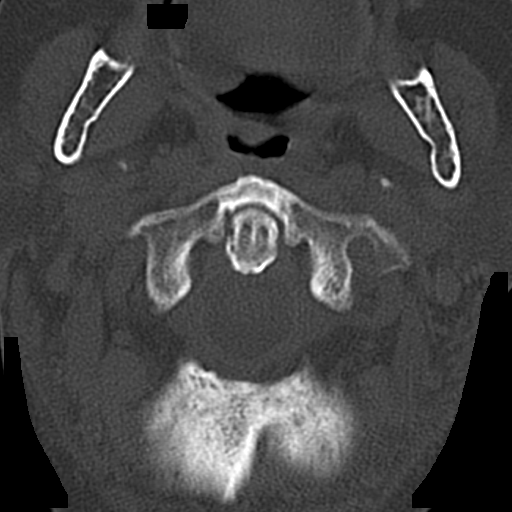
[im 75/86  bone]
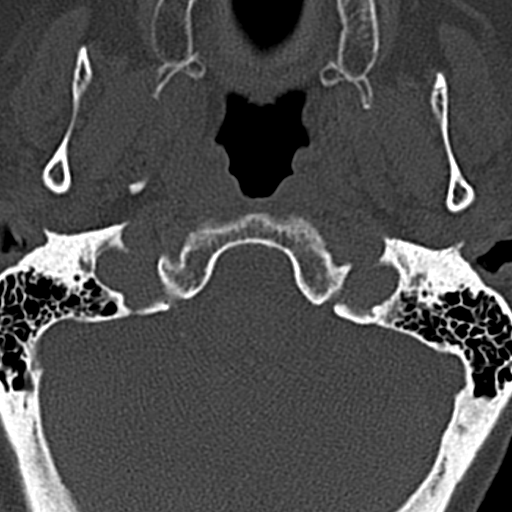

[Series 16: coronal soft · coronal · 0.37mm/px · 3 of 79 slices shown]
[im 16/79  bone]
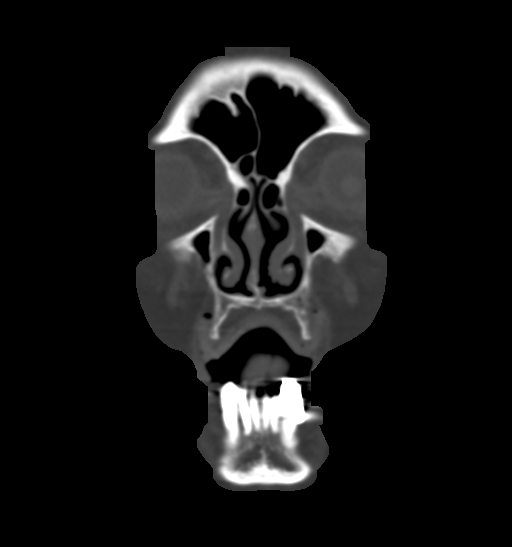
[im 32/79  bone]
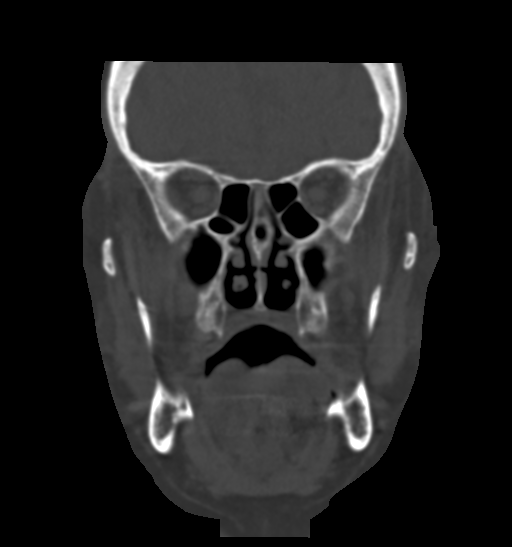
[im 47/79  bone]
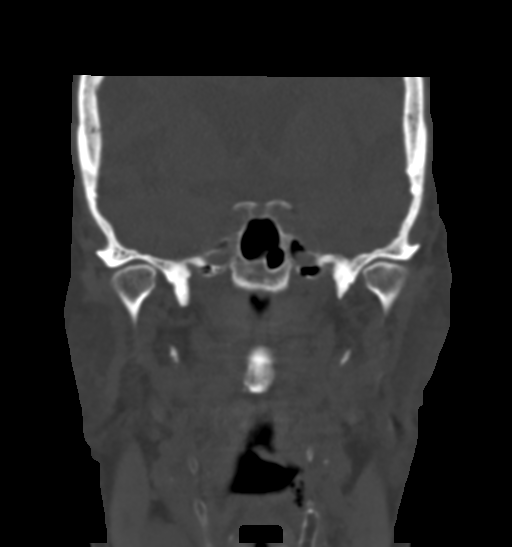

[15 of 47 positions shown; findings below may reference images not displayed]

FINDINGS: CT HEAD FINDINGS

BRAIN: The ventricles and sulci are normal for age. No
intraparenchymal hemorrhage, mass effect nor midline shift. Patchy
supratentorial white matter hypodensities within normal range for
patient's age, though non-specific are most compatible with chronic
small vessel ischemic disease. No acute large vascular territory
infarcts. No abnormal extra-axial fluid collections. Basal cisterns
are patent.

VASCULAR: Moderate calcific atherosclerosis of the carotid siphons.

SKULL: No skull fracture. No significant scalp soft tissue swelling.

OTHER: None.

CT MAXILLOFACIAL FINDINGS

OSSEOUS: The mandible is intact, the condyles are located. No acute
facial fracture. No destructive bony lesions.

ORBITS: Ocular globes intact, status post RIGHT ocular lens implant.
Normal appearance of the orbital contents.

SINUSES: Paranasal sinuses are well aerated. Nasal septum is mildly
deviated to the RIGHT. Included mastoid aircells are well aerated.
Debris within the bilateral external auditory canals.

SOFT TISSUES: Small LEFT supraorbital/frontal scalp hematoma without
subcutaneous gas or radiopaque foreign bodies.

CT CERVICAL SPINE FINDINGS- moderately motion degraded examination.

ALIGNMENT: Minimal grade 1 C4-5 and C7-T1 anterolisthesis without
spondylolysis. Maintenance of cervical lordosis.

SKULL BASE AND VERTEBRAE: Cervical vertebral bodies and posterior
elements are intact. Severe RIGHT upper cervical facet arthropathy.
Intervertebral disc heights preserved with multilevel mild
uncovertebral hypertrophy and endplate spurring. C1-2 articulation
maintained. No destructive bony lesions. Bilateral C7 ribs.

SOFT TISSUES AND SPINAL CANAL: Included prevertebral and paraspinal
soft tissues are nonacute. Moderate calcific atherosclerosis of the
carotid bulbs.

DISC LEVELS: No significant osseous canal stenosis. Moderate RIGHT
C3-4 neural foraminal narrowing.

UPPER CHEST: Minimal RIGHT apical nodular scarring.

OTHER: None.
IMPRESSION: CT HEAD: Small LEFT frontal/ supraorbital scalp hematoma. No skull
fracture.

No acute intracranial process; negative CT HEAD for age.

CT MAXILLOFACIAL: No acute fracture or postseptal hematoma.

CT CERVICAL SPINE: No acute fracture. Minimal grade 1 C4-5 and C7-T1
anterolisthesis on degenerative basis.
